# Patient Record
Sex: Male | Born: 1965 | Race: White | Hispanic: No | Marital: Married | Smoking: Current every day smoker
Health system: Southern US, Community
[De-identification: ages and names within clinical notes are randomized; demographics above are authoritative.]

## PROBLEM LIST (undated history)

## (undated) DIAGNOSIS — I10 Essential (primary) hypertension: Secondary | ICD-10-CM

## (undated) DIAGNOSIS — I639 Cerebral infarction, unspecified: Secondary | ICD-10-CM

## (undated) DIAGNOSIS — G4730 Sleep apnea, unspecified: Secondary | ICD-10-CM

## (undated) DIAGNOSIS — F4310 Post-traumatic stress disorder, unspecified: Secondary | ICD-10-CM

## (undated) HISTORY — PX: FOOT SURGERY: SHX648

## (undated) HISTORY — PX: APPENDECTOMY: SHX54

---

## 2001-10-15 ENCOUNTER — Encounter: Payer: Self-pay | Admitting: Emergency Medicine

## 2001-10-15 ENCOUNTER — Emergency Department (HOSPITAL_COMMUNITY): Admission: EM | Admit: 2001-10-15 | Discharge: 2001-10-15 | Payer: Self-pay | Admitting: Emergency Medicine

## 2001-11-03 ENCOUNTER — Encounter
Admission: RE | Admit: 2001-11-03 | Discharge: 2001-11-03 | Payer: Self-pay | Admitting: Physical Medicine and Rehabilitation

## 2012-11-14 ENCOUNTER — Encounter (HOSPITAL_BASED_OUTPATIENT_CLINIC_OR_DEPARTMENT_OTHER): Payer: Self-pay | Admitting: *Deleted

## 2012-11-14 ENCOUNTER — Emergency Department (HOSPITAL_BASED_OUTPATIENT_CLINIC_OR_DEPARTMENT_OTHER)
Admission: EM | Admit: 2012-11-14 | Discharge: 2012-11-14 | Disposition: A | Discharge status: Home / Self Care | Payer: No Typology Code available for payment source | Attending: Emergency Medicine | Admitting: Emergency Medicine

## 2012-11-14 DIAGNOSIS — W268XXA Contact with other sharp object(s), not elsewhere classified, initial encounter: Secondary | ICD-10-CM | POA: Insufficient documentation

## 2012-11-14 DIAGNOSIS — S21119A Laceration without foreign body of unspecified front wall of thorax without penetration into thoracic cavity, initial encounter: Secondary | ICD-10-CM

## 2012-11-14 DIAGNOSIS — Y929 Unspecified place or not applicable: Secondary | ICD-10-CM | POA: Insufficient documentation

## 2012-11-14 DIAGNOSIS — Z79899 Other long term (current) drug therapy: Secondary | ICD-10-CM | POA: Insufficient documentation

## 2012-11-14 DIAGNOSIS — S21109A Unspecified open wound of unspecified front wall of thorax without penetration into thoracic cavity, initial encounter: Secondary | ICD-10-CM | POA: Insufficient documentation

## 2012-11-14 DIAGNOSIS — Z23 Encounter for immunization: Secondary | ICD-10-CM | POA: Insufficient documentation

## 2012-11-14 DIAGNOSIS — F17200 Nicotine dependence, unspecified, uncomplicated: Secondary | ICD-10-CM | POA: Insufficient documentation

## 2012-11-14 DIAGNOSIS — Y939 Activity, unspecified: Secondary | ICD-10-CM | POA: Insufficient documentation

## 2012-11-14 DIAGNOSIS — F4310 Post-traumatic stress disorder, unspecified: Secondary | ICD-10-CM | POA: Insufficient documentation

## 2012-11-14 HISTORY — DX: Post-traumatic stress disorder, unspecified: F43.10

## 2012-11-14 MED ORDER — LIDOCAINE HCL 2 % IJ SOLN
INTRAMUSCULAR | Status: AC
  Filled 2012-11-14: qty 20

## 2012-11-14 MED ORDER — TETANUS-DIPHTH-ACELL PERTUSSIS 5-2.5-18.5 LF-MCG/0.5 IM SUSP
INTRAMUSCULAR | Status: AC
  Administered 2012-11-14: 0.5 mL via INTRAMUSCULAR
  Filled 2012-11-14: qty 0.5
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
## 2012-11-14 NOTE — ED Notes (Signed)
Vital signs stable. 

## 2012-11-14 NOTE — ED Notes (Signed)
Suture cart is at the bedside set up and ready for the doctor to use. 

## 2012-11-14 NOTE — ED Notes (Signed)
MD at bedside. 
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
## 2012-11-21 NOTE — ED Provider Notes (Signed)
Medical screening examination/treatment/procedure(s) were performed by non-physician practitioner and as supervising physician I was immediately available for consultation/collaboration.   Carleene Cooper III, MD 11/21/12 [phone]

## 2017-01-07 ENCOUNTER — Encounter (HOSPITAL_BASED_OUTPATIENT_CLINIC_OR_DEPARTMENT_OTHER): Payer: Self-pay

## 2017-01-07 ENCOUNTER — Emergency Department (HOSPITAL_BASED_OUTPATIENT_CLINIC_OR_DEPARTMENT_OTHER)
Admission: EM | Admit: 2017-01-07 | Discharge: 2017-01-07 | Disposition: A | Discharge status: Home / Self Care | Payer: No Typology Code available for payment source | Attending: Emergency Medicine | Admitting: Emergency Medicine

## 2017-01-07 DIAGNOSIS — M545 Low back pain: Secondary | ICD-10-CM | POA: Insufficient documentation

## 2017-01-07 DIAGNOSIS — Z5321 Procedure and treatment not carried out due to patient leaving prior to being seen by health care provider: Secondary | ICD-10-CM | POA: Insufficient documentation
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
## 2017-01-07 NOTE — ED Notes (Signed)
Pt states he doesn't wish to wait any longer.  Informed pt that he was next up but he said no.

## 2020-03-13 DIAGNOSIS — R059 Cough, unspecified: Secondary | ICD-10-CM | POA: Diagnosis not present

## 2020-03-13 DIAGNOSIS — U071 COVID-19: Secondary | ICD-10-CM | POA: Diagnosis not present

## 2020-03-13 DIAGNOSIS — R509 Fever, unspecified: Secondary | ICD-10-CM | POA: Diagnosis not present

## 2020-03-13 DIAGNOSIS — Z20822 Contact with and (suspected) exposure to covid-19: Secondary | ICD-10-CM | POA: Diagnosis not present

## 2020-03-20 DIAGNOSIS — R059 Cough, unspecified: Secondary | ICD-10-CM | POA: Diagnosis not present

## 2020-03-20 DIAGNOSIS — U071 COVID-19: Secondary | ICD-10-CM | POA: Diagnosis not present

## 2020-03-29 DIAGNOSIS — G4733 Obstructive sleep apnea (adult) (pediatric): Secondary | ICD-10-CM | POA: Diagnosis not present

## 2020-03-29 DIAGNOSIS — F4310 Post-traumatic stress disorder, unspecified: Secondary | ICD-10-CM | POA: Diagnosis not present

## 2020-03-29 DIAGNOSIS — Z23 Encounter for immunization: Secondary | ICD-10-CM | POA: Diagnosis not present

## 2020-03-29 DIAGNOSIS — R059 Cough, unspecified: Secondary | ICD-10-CM | POA: Diagnosis not present

## 2020-07-25 ENCOUNTER — Emergency Department (HOSPITAL_COMMUNITY): Payer: No Typology Code available for payment source

## 2020-07-25 ENCOUNTER — Observation Stay (HOSPITAL_COMMUNITY)
Admission: EM | Admit: 2020-07-25 | Discharge: 2020-07-26 | Disposition: A | Discharge status: Home / Self Care | Payer: No Typology Code available for payment source | Attending: Internal Medicine | Admitting: Internal Medicine

## 2020-07-25 ENCOUNTER — Other Ambulatory Visit: Payer: Self-pay

## 2020-07-25 DIAGNOSIS — I719 Aortic aneurysm of unspecified site, without rupture: Secondary | ICD-10-CM | POA: Diagnosis not present

## 2020-07-25 DIAGNOSIS — R531 Weakness: Secondary | ICD-10-CM | POA: Diagnosis not present

## 2020-07-25 DIAGNOSIS — I10 Essential (primary) hypertension: Secondary | ICD-10-CM | POA: Diagnosis not present

## 2020-07-25 DIAGNOSIS — Z87891 Personal history of nicotine dependence: Secondary | ICD-10-CM | POA: Diagnosis not present

## 2020-07-25 DIAGNOSIS — R030 Elevated blood-pressure reading, without diagnosis of hypertension: Secondary | ICD-10-CM | POA: Diagnosis not present

## 2020-07-25 DIAGNOSIS — Z20822 Contact with and (suspected) exposure to covid-19: Secondary | ICD-10-CM | POA: Diagnosis not present

## 2020-07-25 DIAGNOSIS — R29810 Facial weakness: Secondary | ICD-10-CM | POA: Diagnosis not present

## 2020-07-25 DIAGNOSIS — R200 Anesthesia of skin: Secondary | ICD-10-CM | POA: Diagnosis present

## 2020-07-25 DIAGNOSIS — R17 Unspecified jaundice: Secondary | ICD-10-CM | POA: Diagnosis not present

## 2020-07-25 DIAGNOSIS — G459 Transient cerebral ischemic attack, unspecified: Principal | ICD-10-CM | POA: Diagnosis present

## 2020-07-25 DIAGNOSIS — I351 Nonrheumatic aortic (valve) insufficiency: Secondary | ICD-10-CM | POA: Insufficient documentation

## 2020-07-25 DIAGNOSIS — R9431 Abnormal electrocardiogram [ECG] [EKG]: Secondary | ICD-10-CM | POA: Diagnosis not present

## 2020-07-25 LAB — DIFFERENTIAL
Abs Immature Granulocytes: 0.05 10*3/uL (ref 0.00–0.07)
Basophils Absolute: 0.1 10*3/uL (ref 0.0–0.1)
Basophils Relative: 1 %
Eosinophils Absolute: 0.1 10*3/uL (ref 0.0–0.5)
Eosinophils Relative: 1 %
Immature Granulocytes: 1 %
Lymphocytes Relative: 29 %
Lymphs Abs: 2.6 10*3/uL (ref 0.7–4.0)
Monocytes Absolute: 0.5 10*3/uL (ref 0.1–1.0)
Monocytes Relative: 6 %
Neutro Abs: 5.5 10*3/uL (ref 1.7–7.7)
Neutrophils Relative %: 62 %

## 2020-07-25 LAB — URINALYSIS, ROUTINE W REFLEX MICROSCOPIC
Bilirubin Urine: NEGATIVE
Glucose, UA: NEGATIVE mg/dL
Hgb urine dipstick: NEGATIVE
Ketones, ur: NEGATIVE mg/dL
Leukocytes,Ua: NEGATIVE
Nitrite: NEGATIVE
Protein, ur: NEGATIVE mg/dL
Specific Gravity, Urine: 1.004 — ABNORMAL LOW (ref 1.005–1.030)
pH: 7 (ref 5.0–8.0)

## 2020-07-25 LAB — CBC
HCT: 45.6 % (ref 39.0–52.0)
HCT: 46.9 % (ref 39.0–52.0)
Hemoglobin: 16.1 g/dL (ref 13.0–17.0)
Hemoglobin: 17.1 g/dL — ABNORMAL HIGH (ref 13.0–17.0)
MCH: 32.3 pg (ref 26.0–34.0)
MCH: 32.9 pg (ref 26.0–34.0)
MCHC: 35.3 g/dL (ref 30.0–36.0)
MCHC: 36.5 g/dL — ABNORMAL HIGH (ref 30.0–36.0)
MCV: 91.4 fL (ref 80.0–100.0)
MCV: 90.4 fL (ref 80.0–100.0)
Platelets: 174 10*3/uL (ref 150–400)
Platelets: 232 10*3/uL (ref 150–400)
RBC: 4.99 MIL/uL (ref 4.22–5.81)
RBC: 5.19 MIL/uL (ref 4.22–5.81)
RDW: 12.6 % (ref 11.5–15.5)
RDW: 12.8 % (ref 11.5–15.5)
WBC: 8.9 10*3/uL (ref 4.0–10.5)
WBC: 9.5 10*3/uL (ref 4.0–10.5)
nRBC: 0 % (ref 0.0–0.2)
nRBC: 0 % (ref 0.0–0.2)

## 2020-07-25 LAB — COMPREHENSIVE METABOLIC PANEL
ALT: 35 U/L (ref 0–44)
AST: 26 U/L (ref 15–41)
Albumin: 3.7 g/dL (ref 3.5–5.0)
Alkaline Phosphatase: 68 U/L (ref 38–126)
Anion gap: 8 (ref 5–15)
BUN: 8 mg/dL (ref 6–20)
CO2: 20 mmol/L — ABNORMAL LOW (ref 22–32)
Calcium: 8.4 mg/dL — ABNORMAL LOW (ref 8.9–10.3)
Chloride: 112 mmol/L — ABNORMAL HIGH (ref 98–111)
Creatinine, Ser: 0.89 mg/dL (ref 0.61–1.24)
GFR, Estimated: >60 mL/min (ref >60)
Glucose, Bld: 87 mg/dL (ref 70–99)
Potassium: 3.7 mmol/L (ref 3.5–5.1)
Sodium: 140 mmol/L (ref 135–145)
Total Bilirubin: 1.4 mg/dL — ABNORMAL HIGH (ref 0.3–1.2)
Total Protein: 6 g/dL — ABNORMAL LOW (ref 6.5–8.1)

## 2020-07-25 LAB — I-STAT CHEM 8, ED
BUN: 9 mg/dL (ref 6–20)
Calcium, Ion: 0.95 mmol/L — ABNORMAL LOW (ref 1.15–1.40)
Chloride: 113 mmol/L — ABNORMAL HIGH (ref 98–111)
Creatinine, Ser: 0.8 mg/dL (ref 0.61–1.24)
Glucose, Bld: 85 mg/dL (ref 70–99)
HCT: 42 % (ref 39.0–52.0)
Hemoglobin: 14.3 g/dL (ref 13.0–17.0)
Potassium: 3.6 mmol/L (ref 3.5–5.1)
Sodium: 143 mmol/L (ref 135–145)
TCO2: 22 mmol/L (ref 22–32)

## 2020-07-25 LAB — PROTIME-INR
INR: 1 (ref 0.8–1.2)
Prothrombin Time: 13.1 seconds (ref 11.4–15.2)

## 2020-07-25 LAB — LIPID PANEL
Cholesterol: 160 mg/dL (ref 0–200)
HDL: 24 mg/dL — ABNORMAL LOW (ref >40)
LDL Cholesterol: 109 mg/dL — ABNORMAL HIGH (ref 0–99)
Total CHOL/HDL Ratio: 6.7 RATIO
Triglycerides: 135 mg/dL (ref <150)
VLDL: 27 mg/dL (ref 0–40)

## 2020-07-25 LAB — RESP PANEL BY RT-PCR (FLU A&B, COVID) ARPGX2
Influenza A by PCR: NEGATIVE
Influenza B by PCR: NEGATIVE
SARS Coronavirus 2 by RT PCR: NEGATIVE

## 2020-07-25 LAB — MAGNESIUM: Magnesium: 1.9 mg/dL (ref 1.7–2.4)

## 2020-07-25 LAB — RAPID URINE DRUG SCREEN, HOSP PERFORMED
Amphetamines: NOT DETECTED
Barbiturates: NOT DETECTED
Benzodiazepines: NOT DETECTED
Cocaine: NOT DETECTED
Opiates: NOT DETECTED
Tetrahydrocannabinol: NOT DETECTED

## 2020-07-25 LAB — HEMOGLOBIN A1C
Hgb A1c MFr Bld: 5.3 % (ref 4.8–5.6)
Mean Plasma Glucose: 105.41 mg/dL

## 2020-07-25 LAB — APTT: aPTT: 25 seconds (ref 24–36)

## 2020-07-25 LAB — HIV ANTIBODY (ROUTINE TESTING W REFLEX): HIV Screen 4th Generation wRfx: NONREACTIVE

## 2020-07-25 LAB — CREATININE, SERUM
Creatinine, Ser: 0.93 mg/dL (ref 0.61–1.24)
GFR, Estimated: >60 mL/min (ref >60)

## 2020-07-25 LAB — PHOSPHORUS: Phosphorus: 2.8 mg/dL (ref 2.5–4.6)

## 2020-07-25 LAB — ETHANOL: Alcohol, Ethyl (B): <10 mg/dL (ref <10)

## 2020-07-25 IMAGING — CT CT ANGIO NECK
1 of 11 series · 5 of 33 positions shown · IV contrast (omnipaque)
Comparison: None.

CLINICAL DATA: Transient ischemic attack.

EXAM:
CT ANGIOGRAPHY HEAD AND NECK
TECHNIQUE: Multidetector CT imaging of the head and neck was performed using
the standard protocol during bolus administration of intravenous
contrast. Multiplanar CT image reconstructions and MIPs were
obtained to evaluate the vascular anatomy. Carotid stenosis
measurements (when applicable) are obtained utilizing NASCET
criteria, using the distal internal carotid diameter as the
denominator.
CONTRAST:  75mL OMNIPAQUE IOHEXOL 350 MG/ML SOLN

[Series 12: ax thins · axial · 0.39mm/px · z∈[+1058,+1306]mm · 5 of 373 slices shown]
[im 63/373  soft-tissue]
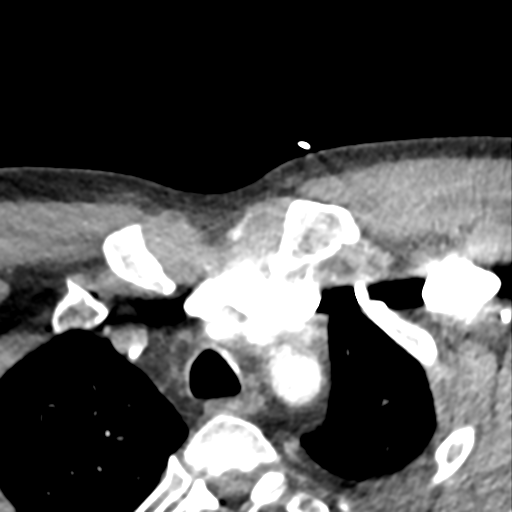
[im 125/373  bone]
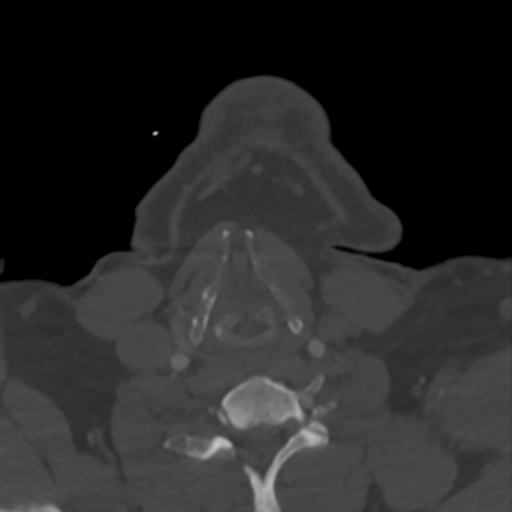
[im 187/373  soft-tissue]
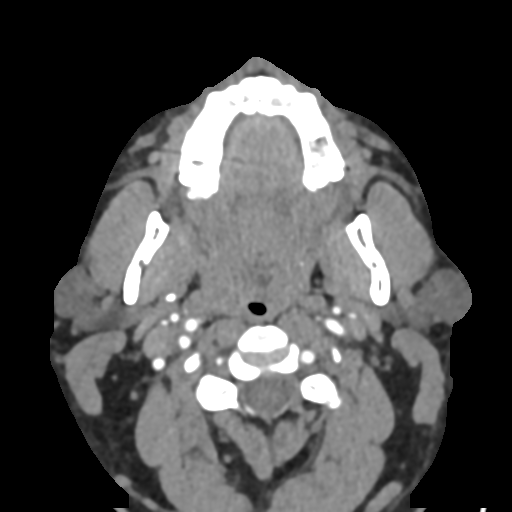
[im 249/373  bone]
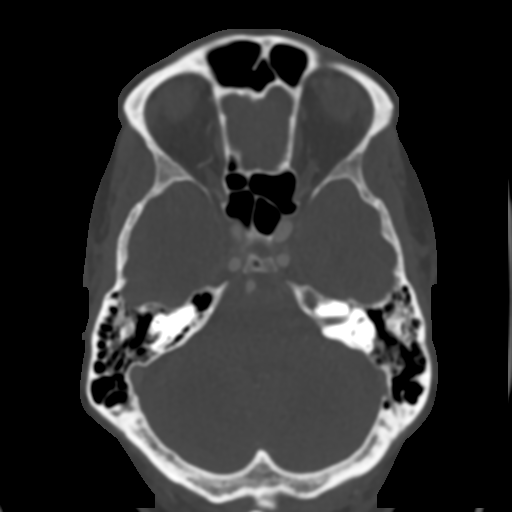
[im 311/373  soft-tissue]
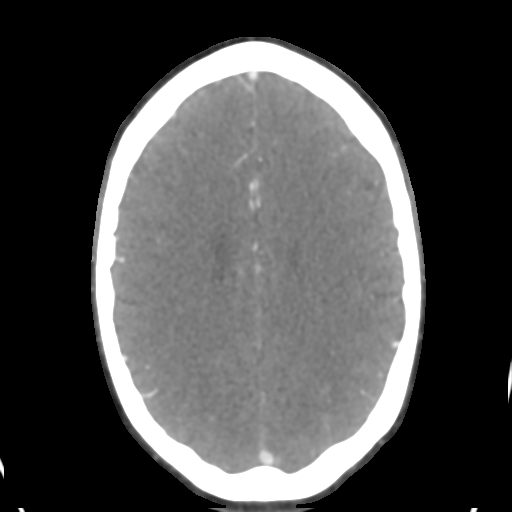

[5 of 33 positions shown; findings below may reference images not displayed]

FINDINGS: CT HEAD FINDINGS

Brain: No evidence of acute infarction, hemorrhage, hydrocephalus,
extra-axial collection or mass lesion/mass effect. Remote lacunar
infarct in the left cerebellar hemisphere.

Skull: No fracture or destructive lesion. Mastoids and middle ears
are clear.

Sinuses: Mucosal thickening of the bilateral ethmoid cells and
maxillary sinuses.

Orbits: No acute finding.

Review of the MIP images confirms the above findings

CTA NECK FINDINGS

Aortic arch: Standard branching. Imaged portion shows no evidence of
aneurysm or dissection. No significant stenosis of the major arch
vessel origins.

Right carotid system: No evidence of dissection, stenosis or
occlusion. Increased tortuosity of the cervical segment of the right
ICA.

Left carotid system: No evidence of dissection, stenosis or
occlusion. Increased tortuosity of the cervical segment of the left
ICA.

Vertebral arteries: Dominant left vertebral artery with hypoplastic
right vertebral artery. No evidence of dissection, stenosis or
occlusion.

Skeleton: Degenerative changes of the cervical spine. No aggressive
bone lesion identified.

Other neck: Negative.

Upper chest: Negative.

Review of the MIP images confirms the above findings

CTA HEAD FINDINGS

Anterior circulation: Mild calcified plaques in the bilateral
paraclinoid internal carotid arteries. No significant stenosis,
proximal occlusion, aneurysm, or vascular malformation.

Posterior circulation: Mild atherosclerotic plaques of the left
vertebral artery. The right vertebral artery ends in PICA. No
significant stenosis, proximal occlusion, aneurysm, or vascular
malformation.

Venous sinuses: As permitted by contrast timing, patent.

Anatomic variants: Hypoplastic right vertebral artery ending in
PICA.

Review of the MIP images confirms the above findings
IMPRESSION: 1. No acute intracranial abnormality. Remote left cerebellar lacunar
infarct.
2. No large vessel occlusion, hemodynamically significant stenosis,
or evidence of dissection.

## 2020-07-25 MED ORDER — CLOPIDOGREL BISULFATE 75 MG PO TABS
75.0000 mg | ORAL_TABLET | Freq: Every day | ORAL | Status: DC
  Administered 2020-07-26: 75 mg via ORAL
  Filled 2020-07-25: qty 1

## 2020-07-25 MED ORDER — ACETAMINOPHEN 325 MG PO TABS
650.0000 mg | ORAL_TABLET | Freq: Four times a day (QID) | ORAL | Status: DC | PRN
  Administered 2020-07-25 – 2020-07-26 (×2): 650 mg via ORAL
  Filled 2020-07-25 (×2): qty 2

## 2020-07-25 MED ORDER — POLYETHYLENE GLYCOL 3350 17 G PO PACK: 17.0000 g | PACK | Freq: Every day | ORAL | Status: DC | PRN

## 2020-07-25 MED ORDER — QUETIAPINE FUMARATE 100 MG PO TABS
100.0000 mg | ORAL_TABLET | Freq: Every day | ORAL | Status: DC
  Administered 2020-07-25: 100 mg via ORAL
  Filled 2020-07-25 (×2): qty 1

## 2020-07-25 MED ORDER — ENOXAPARIN SODIUM 40 MG/0.4ML ~~LOC~~ SOLN
40.0000 mg | SUBCUTANEOUS | Status: DC
  Administered 2020-07-26: 40 mg via SUBCUTANEOUS
  Filled 2020-07-25 (×2): qty 0.4

## 2020-07-25 MED ORDER — ACETAMINOPHEN 650 MG RE SUPP: 650.0000 mg | Freq: Four times a day (QID) | RECTAL | Status: DC | PRN

## 2020-07-25 MED ORDER — IOHEXOL 350 MG/ML SOLN
75.0000 mL | Freq: Once | INTRAVENOUS | Status: AC | PRN
  Administered 2020-07-25: 75 mL via INTRAVENOUS

## 2020-07-25 MED ORDER — ASPIRIN EC 81 MG PO TBEC
81.0000 mg | DELAYED_RELEASE_TABLET | Freq: Every day | ORAL | Status: DC
  Administered 2020-07-26: 81 mg via ORAL
  Filled 2020-07-25: qty 1

## 2020-07-25 NOTE — Hospital Course (Addendum)
  Smokes cigarette (5-6 a day for the last two weeks), pack per day since 5868 No ETOH  No illicit drug uses    Appy, colonoscopy a year ago No regular PCP Lives with wife
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
## 2020-07-25 NOTE — ED Notes (Signed)
This RN entered the room to update the pt's vitals because his BP cuff was off. The pt removed his BP cuff and began yelling that no one has done anything for him.  A doctor told him he was gonna get an aspirin and no one has given him an aspirin.  I asked if he had a HA and he said no but the doctor said it and it hasnt happened.  I explained that the doctor had not ordered an aspirin.  He began yelling that he was tired of waiting down here and he wanted a bed upstairs.  I tried to explain the admission process and he yelled that there weren't any other pt's out there in the hall so he didn't understand why he couldn't get a bed.  I explained that the department was full of people, including in the halls and that we had 20+ people waiting for beds upstairs.  He continued to yell that no one had done anything.    The pt has received two food trays this afternoon, had multiple doctors see him since 1500 and I have been in the room several times.   I told him I would look into the aspirin order.

## 2020-07-25 NOTE — ED Provider Notes (Signed)
4:43 PM Signout from [employer].   Patient with TIA symptoms today.  Plan for admission.  Awaiting CT imaging.  CT imaging shows remote lacunar infarct.  Neurology has seen patient.  Plan for unassigned medicine.  Patient personally evaluated and updated on CT imaging results.  Discussed with internal medicine teaching service who will admit for TIA evaluation.  BP (!) 149/94   Pulse 80   Temp 98.3 F (36.8 C) (Oral)   Resp 17   SpO2 99%       Carlisle Cater, PA-C 07/25/20 1646    Wyvonnia Dusky, MD 07/26/20 [phone]

## 2020-07-25 NOTE — ED Notes (Signed)
Pt refused to wear BP cuff at this time.

## 2020-07-25 NOTE — H&P (Addendum)
Date: 07/25/2020               Patient Name:  Ryan Middleton MRN: [id number]  DOB: [birth year] Age / Sex: 55 y.o., male   PCP: [place] Service: Internal Medicine Teaching Service         Attending Physician: Dr. Davonna Belling, MD    First Contact: Dr. Konrad Penta Pager: [phone]  Second Contact: Dr. Marianna Payment Pager: [phone]       After Hours (After 5p/  First Contact Pager: [phone]  weekends / holidays): Second Contact Pager: [phone]   Chief Complaint: Left-Sided Weakness  History of Present Illness:   Ryan Middleton is a 55 y.o. gentleman w/ PMHx PTSD and HLD, who presented to the ED after experiencing a transient episode of left arm and left leg weakness while driving to work. His symptoms came on suddenly, then dissipated slowly. He endorses associated diaphoresis during the episode, which he attributes to anxiety. He denies any other associated symptoms including numbness, tingling, slurred speech, facial numbness, headache, dizziness, CP, SOB, or any other symptoms. 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 Denies any known cardiovascular history although states he does not follow up routinely with a PCP. Denies history of diabetes although does endorse shakiness and weakness when he goes longer than 12 hours without eating, which is chronic. Has not eaten all day today.  Home Medications: Seroquel 100mg  nightly   Allergies: Allergies as of 07/25/2020 - Review Complete 01/07/2017  Allergen Reaction Noted  . Hydrocodone Nausea And Vomiting 11/14/2012  . Simvastatin Rash 01/07/2017   Past Medical History:  Diagnosis Date  . Post traumatic stress disorder     Family History:  Patient is adopted and does not know his birth family's history.  Adopted mother and father have both had strokes.   Social History:  Patient lives at home with his wife.  He smokes about 6 cigarettes per day, having recently cut back from ~1 pack per day  since 1992 (~30 pack year history). Drinks alcohol only occasionally.  Denies any illicit drug use.  Review of Systems: A complete ROS was negative except as per HPI.   Physical Exam: Blood pressure (!) 149/94, pulse 80, temperature 98.3 F (36.8 C), temperature source Oral, resp. rate 17, SpO2 99 %.  General: Patient appears well. No acute distress. Eyes: Sclera non-icteric. No conjunctival injection.  HENT: Slightly dry mucus membranes. No nasal discharge. Respiratory: Lungs are CTA, bilaterally. No wheezes, rales, or rhonchi.  Cardiovascular: Regular rate and rhythm. No murmurs, rubs, or gallops. No lower extremity edema. Neurological: CN II-XII intact. Strength is 5/5 in bilateral upper and lower extremities. Sensation to light touch is intact and equal in bilateral upper and lower extremities. No dysdiadochokinesia. Musculoskeletal: Normal muscle bulk and tone. Able to move all four extremities equally.   Abdominal: Abdomen is obese, soft and not tender to palpation. Bowel sounds intact. No rebound or guarding. Skin: Scattered seborrheic keratoses present. No other lesions or rashes.  Psych: Appears slightly anxious. Pleasant and cooperative.   EKG: personally reviewed my interpretation is NSR at 88 bpm. Normal PR, QRS, QTc intervals. No consecutive T wave inversions or ST segment changes to suggest acute ischemia. Unremarkable EKG.  CTA Head / Neck: there are mild atherosclerotic plaques within the bilateral paraclinoid ICA's and L vertebral arteries and a hypoplastic R vertebral artery with evidence of a remote L cerebellar lacunar  infarct, although no acute/significant intracranial abnormalities.   Assessment & Plan by Problem: Active Problems:   * No active hospital problems. *  # TIA Patient's transient, isolated left arm and left leg numbness are concerning for TIA. CTA head / neck in the ED did show a remote left cerebellar lacunar infarct, although no evidence of acute  hemorrhage or infarct, without significant arterial stenosis. Neurology were consulted. Urinalysis, urine toxicology, ETOH level, viral testing, PT/APTT, and CBC w/ diff were unremarkable. His symptoms resolved PTA and he has remained HDS and asymptomatic in the ED.  - Neurology consulted, appreciate their recommendations  - Check ECHO complete w/ bubble study  - Check lipid panel - Check Hgb A1c  - Check HIV testing  - Start regular diet  - Check morning CBC  - Continuous cardiac monitoring  - Continuous pulse oximetry  - Consult PT/OT - Consult TOC for PCP needs  # Hx of Hyperlipidemia  Patient states he was told he had high cholesterol 40 years ago, although is not currently taking any cholesterol-lowering medications.  - Check lipid panel  - Check morning CMP   # Elevated Blood Pressures  Blood pressure has been stably elevated since arrival. He does have a history of lacunar infarct per imaging, concerning for long-standing, untreated hypertension. Does not take any antihypertensives at home.  - Continue to monitor   # Hypocalcemia  Calcium is corrects to 8.6 for albumin. Ionized calcium is slightly low at 0.95. Denies any paresthesias, bone pain, or any symptoms.  - Check magnesium - Check phosphorus  - If magnesium is low, will replace and monitor calcium response   # Hyperbilirubinemia  Bilirubin is mildly elevated to 1.4. Liver enzymes and ALP are within normal limits. Hemoglobin is normal with normal RDW making hemolysis unlikely. No RUQ symptoms to suggest obstructive cause.  - Continue to monitor  Code Status: Full code  Diet: Regular  DVT PPx: Lovenox 40mg  daily  IVF: None   Dispo: Admit patient to Inpatient with expected length of stay greater than 2 midnights.  Signed: Jeralyn Bennett, MD 07/25/2020, 4:46 PM  Pager: [phone] After 5pm on weekdays and 1pm on weekends: On Call pager: [phone]

## 2020-07-25 NOTE — ED Provider Notes (Signed)
[facility] Provider Note   CSN: [id number] Arrival date & time: 07/25/20  1249     History Chief Complaint  Patient presents with  . Numbness    Sudden onset numbness left UE and LE  . Weakness    Ryan Middleton is a 55 y.o. male with a hx of PTSD who presents to the ED via EMS with complaints of an episode of left UE/LE numbness/weakness that began shortly after 11:15AM and is now resolved.  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  He describes this as a heaviness and having trouble keeping his arm up on the steering wheel.  This persisted until he was able to park the car, when he got out he was having some difficulty walking due to the left lower extremity dragging. He was able to drive back to his main work site and sxs seemed to be resolving but given the distribution he asked his boss to call 911. He states that currently all sxs are resolved. He still feels a bit off, but no focal numbness/weakness at this time. He has never had anything like this happen previously.  He denies any headache, visual disturbance, facial droop, speech abnormality, syncope, chest pain, or dyspnea.  Denies any alcohol or drug use.  HPI     Past Medical History:  Diagnosis Date  . Post traumatic stress disorder     There are no problems to display for this patient.   Past Surgical History:  Procedure Laterality Date  . APPENDECTOMY    . FOOT SURGERY         No family history on file.  Social History   Tobacco Use  . Smoking status: Former Research scientist (life sciences)  . Smokeless tobacco: Never Used  Substance Use Topics  . Alcohol use: Yes    Comment: occ  . Drug use: No    Home Medications Prior to Admission medications   Medication Sig Start Date End Date Taking? Authorizing Provider  QUEtiapine (SEROQUEL)  300 MG tablet Take 300 mg by mouth at bedtime.    [provider]    Allergies    Hydrocodone and Simvastatin  Review of Systems   Review of Systems  Constitutional: Negative for chills and fever.  Eyes: Negative for visual disturbance.  Respiratory: Negative for shortness of breath.   Cardiovascular: Negative for chest pain.  Gastrointestinal: Negative for abdominal pain.  Neurological: Positive for weakness and numbness. Negative for seizures, syncope, speech difficulty and headaches.  All other systems reviewed and are negative.   Physical Exam Updated Vital Signs BP (!) 145/95 (BP Location: Right Arm)   Pulse 84   Temp 98.3 F (36.8 C) (Oral)   Resp 15   SpO2 97%   Physical Exam Vitals and nursing note reviewed.  Constitutional:      General: He is not in acute distress.    Appearance: Normal appearance. He is not toxic-appearing.  HENT:     Head: Normocephalic and atraumatic.     Mouth/Throat:     Pharynx: Oropharynx is clear. Uvula midline.  Eyes:     General: Vision grossly intact. Gaze aligned appropriately.     Extraocular Movements: Extraocular movements intact.     Conjunctiva/sclera: Conjunctivae normal.     Pupils: Pupils are equal, round, and reactive to light.  Comments: No proptosis.   Cardiovascular:     Rate and Rhythm: Normal rate and regular rhythm.  Pulmonary:     Effort: Pulmonary effort is normal.     Breath sounds: Normal breath sounds.  Abdominal:     General: There is no distension.     Palpations: Abdomen is soft.     Tenderness: There is no abdominal tenderness. There is no guarding or rebound.  Musculoskeletal:     Cervical back: Normal range of motion and neck supple. No rigidity.  Skin:    General: Skin is warm and dry.  Neurological:     Mental Status: He is alert.     Comments: Alert. Clear speech. No facial droop. CNIII-XII grossly intact. Bilateral upper and lower extremities' sensation grossly intact. & intact to  sharp/dull touch. 5/5 symmetric strength with grip strength, elbow flexion/extension, and with plantar and dorsi flexion bilaterally . Normal finger to nose bilaterally. Negative pronator drift. Able to lift and hold LEs off of the stretcher. Gait intact without foot drop or noted ataxia.    Psychiatric:        Mood and Affect: Mood normal.        Behavior: Behavior normal.     ED Results / Procedures / Treatments   Labs (all labs ordered are listed, but only abnormal results are displayed) Labs Reviewed - No data to display  EKG None  Radiology No results found.  Procedures Procedures   Medications Ordered in ED Medications - No data to display  ED Course  I have reviewed the triage vital signs and the nursing notes.  Pertinent labs & imaging results that were available during my care of the patient were reviewed by me and considered in my medical decision making (see chart for details).    MDM Rules/Calculators/A&P                          Patient presents to the ED with complaints of left upper and lower extremity numbness/weakness which is currently resolved.  Patient is nontoxic, resting comfortably, his blood pressure is mildly elevated, vitals are otherwise unremarkable.  He has no focal neurologic deficits on exam.  Given reported symptoms are resolved and exam is without deficits no code stroke activation at this time.  Patient was informed that if any of his symptoms or any neurologic symptoms arise he should alert staff immediately.  We will plan for labs and CT imaging of the head neck. Anticipate admission for TIA evaluation.   Additional history obtained:  Additional history obtained from chart review & nursing note review.   Lab Tests:  I Ordered, reviewed, and interpreted labs, which included:  CBC: Unremarkable PT/INR/APTT: Within normal limits I-STAT Chem-8 with preserved renal function. Additional labs pending @ this time.  Imaging Studies ordered:  I  ordered imaging studies which included CTA head/neck.   ED Course:  15:10: CONSULT: Discussed with neurologist Dr. Cheral Marker, agrees with plan for admission, neurology service will see patient in consultation.  15:22: Patient care signed out to [facility] @ change of shift pending remaining work-up and consult for admission.   Findings and plan of care discussed with supervising physician Dr. Alvino Chapel who has evaluated the patient & is in agreement.   Portions of this note were generated with [employer]. Dictation errors may occur despite best attempts at proofreading.  Final Clinical Impression(s) / ED Diagnoses Final diagnoses:  None    Rx /  DC Orders ED Discharge Orders    None       Leafy Kindle 07/25/20 1524    Davonna Belling, MD 07/27/20 [phone]

## 2020-07-25 NOTE — ED Notes (Signed)
Pt apologized for becoming upset earlier.

## 2020-07-25 NOTE — ED Notes (Signed)
Pt transported to CT ?

## 2020-07-26 ENCOUNTER — Inpatient Hospital Stay (HOSPITAL_COMMUNITY): Payer: No Typology Code available for payment source

## 2020-07-26 ENCOUNTER — Inpatient Hospital Stay (HOSPITAL_BASED_OUTPATIENT_CLINIC_OR_DEPARTMENT_OTHER): Payer: No Typology Code available for payment source

## 2020-07-26 DIAGNOSIS — E7800 Pure hypercholesterolemia, unspecified: Secondary | ICD-10-CM

## 2020-07-26 DIAGNOSIS — E785 Hyperlipidemia, unspecified: Secondary | ICD-10-CM | POA: Diagnosis not present

## 2020-07-26 DIAGNOSIS — Z87891 Personal history of nicotine dependence: Secondary | ICD-10-CM | POA: Diagnosis not present

## 2020-07-26 DIAGNOSIS — I351 Nonrheumatic aortic (valve) insufficiency: Secondary | ICD-10-CM | POA: Diagnosis not present

## 2020-07-26 DIAGNOSIS — R030 Elevated blood-pressure reading, without diagnosis of hypertension: Secondary | ICD-10-CM | POA: Diagnosis not present

## 2020-07-26 DIAGNOSIS — F1010 Alcohol abuse, uncomplicated: Secondary | ICD-10-CM

## 2020-07-26 DIAGNOSIS — G459 Transient cerebral ischemic attack, unspecified: Secondary | ICD-10-CM | POA: Diagnosis not present

## 2020-07-26 DIAGNOSIS — F17200 Nicotine dependence, unspecified, uncomplicated: Secondary | ICD-10-CM | POA: Diagnosis not present

## 2020-07-26 DIAGNOSIS — Z20822 Contact with and (suspected) exposure to covid-19: Secondary | ICD-10-CM | POA: Diagnosis not present

## 2020-07-26 DIAGNOSIS — R17 Unspecified jaundice: Secondary | ICD-10-CM | POA: Diagnosis not present

## 2020-07-26 DIAGNOSIS — I719 Aortic aneurysm of unspecified site, without rupture: Secondary | ICD-10-CM | POA: Diagnosis not present

## 2020-07-26 LAB — ECHOCARDIOGRAM COMPLETE BUBBLE STUDY
AR max vel: 3 cm2
AV Area VTI: 3.14 cm2
AV Area mean vel: 3.26 cm2
AV Mean grad: 6 mmHg
AV Peak grad: 10.5 mmHg
Ao pk vel: 1.62 m/s
Area-P 1/2: 3.02 cm2
Calc EF: 63.4 %
P 1/2 time: 506 msec
S' Lateral: 1.8 cm
Single Plane A2C EF: 71.4 %
Single Plane A4C EF: 53 %

## 2020-07-26 LAB — COMPREHENSIVE METABOLIC PANEL
ALT: 34 U/L (ref 0–44)
AST: 19 U/L (ref 15–41)
Albumin: 3.7 g/dL (ref 3.5–5.0)
Alkaline Phosphatase: 80 U/L (ref 38–126)
Anion gap: 7 (ref 5–15)
BUN: 10 mg/dL (ref 6–20)
CO2: 24 mmol/L (ref 22–32)
Calcium: 9.3 mg/dL (ref 8.9–10.3)
Chloride: 108 mmol/L (ref 98–111)
Creatinine, Ser: 1.08 mg/dL (ref 0.61–1.24)
GFR, Estimated: >60 mL/min (ref >60)
Glucose, Bld: 112 mg/dL — ABNORMAL HIGH (ref 70–99)
Potassium: 3.7 mmol/L (ref 3.5–5.1)
Sodium: 139 mmol/L (ref 135–145)
Total Bilirubin: 0.9 mg/dL (ref 0.3–1.2)
Total Protein: 6.3 g/dL — ABNORMAL LOW (ref 6.5–8.1)

## 2020-07-26 LAB — CBC
HCT: 47.1 % (ref 39.0–52.0)
Hemoglobin: 16.9 g/dL (ref 13.0–17.0)
MCH: 32.4 pg (ref 26.0–34.0)
MCHC: 35.9 g/dL (ref 30.0–36.0)
MCV: 90.4 fL (ref 80.0–100.0)
Platelets: 195 10*3/uL (ref 150–400)
RBC: 5.21 MIL/uL (ref 4.22–5.81)
RDW: 12.6 % (ref 11.5–15.5)
WBC: 9.3 10*3/uL (ref 4.0–10.5)
nRBC: 0 % (ref 0.0–0.2)

## 2020-07-26 IMAGING — MR MR HEAD W/O CM
12 of 13 series · 44 of 48 positions shown · non-contrast
Comparison: None.

CLINICAL DATA: Transient ischemic attack

EXAM:
MRI HEAD WITHOUT CONTRAST
TECHNIQUE: Multiplanar, multiecho pulse sequences of the brain and surrounding
structures were obtained without intravenous contrast.

[Series 5: DWI · axial · 3.0mm · 0.88mm/px · z∈[-110,+47]mm · 8 of 108 slices shown (1 of 4)]
[im 1/108]
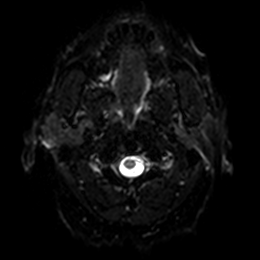
[im 16/108]
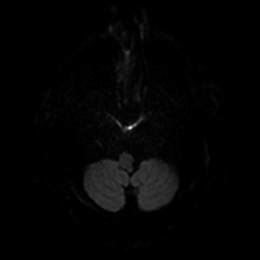
[im 31/108]
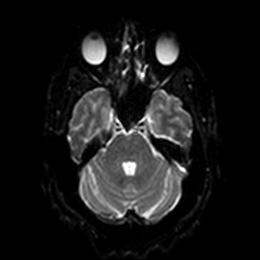
[im 46/108]
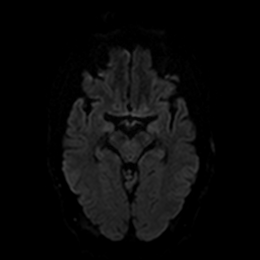
[im 62/108]
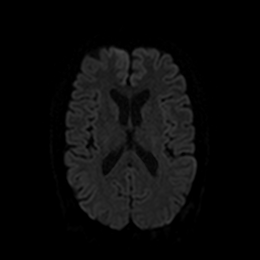
[im 77/108]
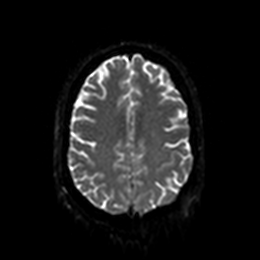
[im 92/108]
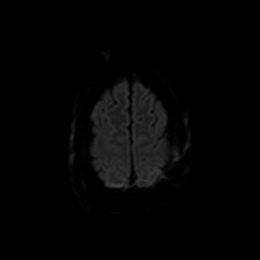
[im 108/108]
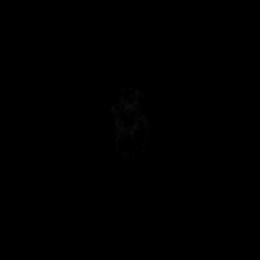

[Series 6: DWI · axial · 3.0mm · 0.88mm/px · z∈[-110,+47]mm · 4 of 54 slices shown (2 of 4)]
[im 1/54]
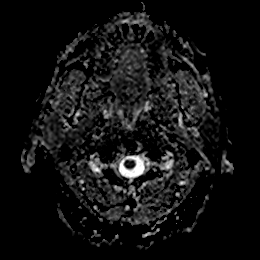
[im 18/54]
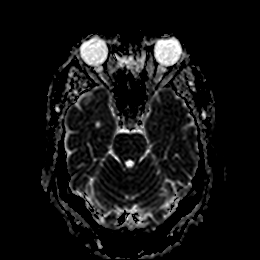
[im 36/54]
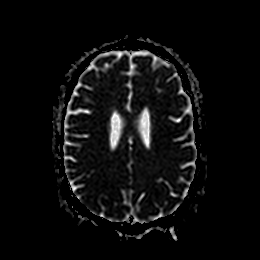
[im 54/54]
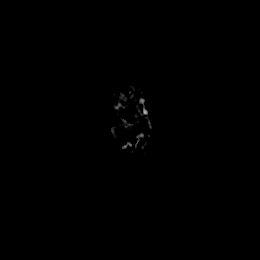

[Series 7: DWI · coronal · 4.0mm · 0.88mm/px · 5 of 70 slices shown (3 of 4)]
[im 1/70]
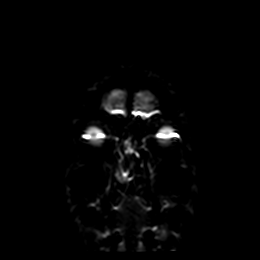
[im 18/70]
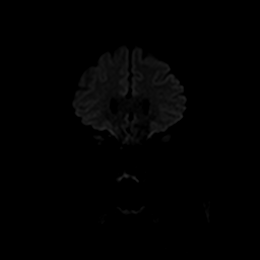
[im 35/70]
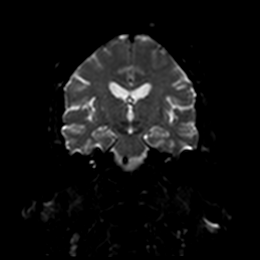
[im 52/70]
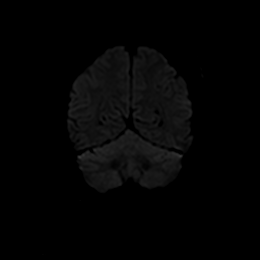
[im 70/70]
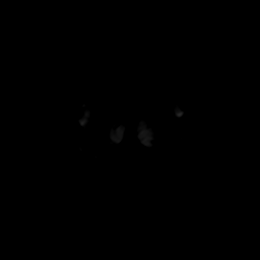

[Series 8: DWI · coronal · 4.0mm · 0.88mm/px · 3 of 35 slices shown (4 of 4)]
[im 1/35]
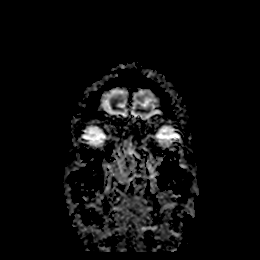
[im 18/35]
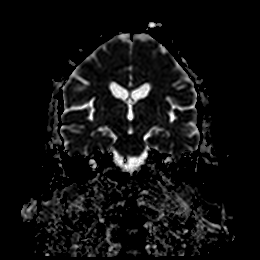
[im 35/35]
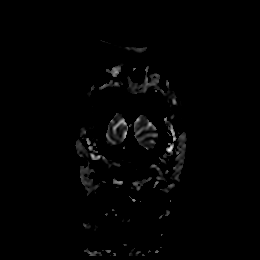

[Series 9: T1 · sagittal · 5.0mm · 0.75mm/px · 2 of 23 slices shown]
[im 1/23]
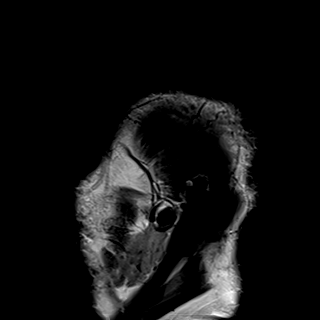
[im 23/23]
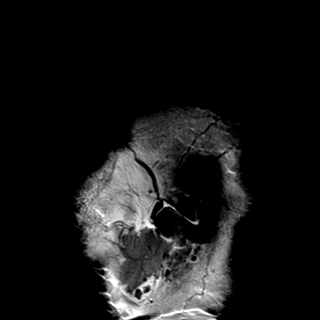

[Series 10: T2 · axial · 5.0mm · 0.72mm/px · z∈[-108,+45]mm · 2 of 27 slices shown (1 of 2)]
[im 1/27]
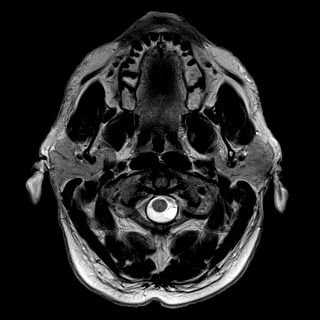
[im 27/27]
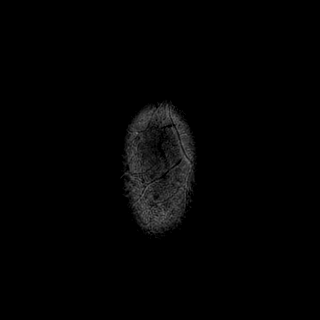

[Series 11: FLAIR · axial · 5.0mm · 0.45mm/px · z∈[-107,+47]mm · 2 of 27 slices shown]
[im 1/27]
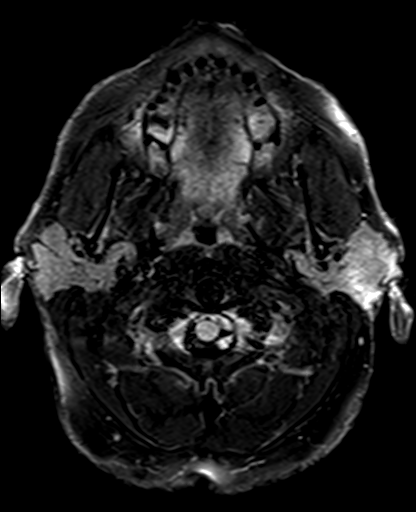
[im 27/27]
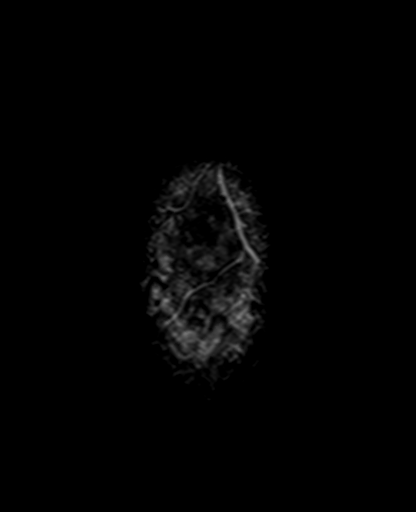

[Series 12: mag_images · axial · 3.0mm · 0.90mm/px · z∈[-117,+57]mm · 4 of 60 slices shown]
[im 1/60]
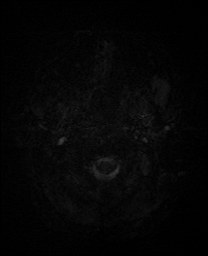
[im 20/60]
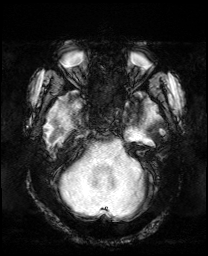
[im 40/60]
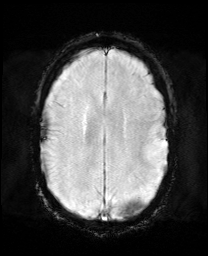
[im 60/60]
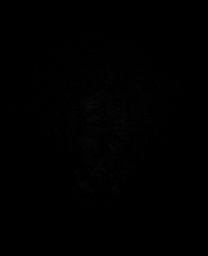

[Series 13: pha_images · axial · 3.0mm · 0.90mm/px · z∈[-117,+48]mm · 4 of 57 slices shown]
[im 1/57]
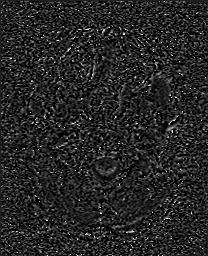
[im 19/57]
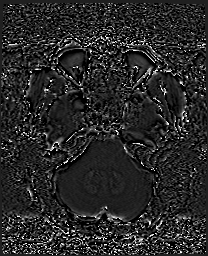
[im 38/57]
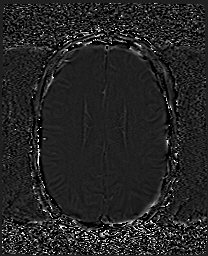
[im 57/57]
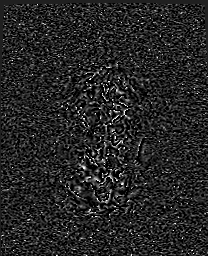

[Series 14: swi_images · axial · 3.0mm · 0.90mm/px · z∈[-117,+57]mm · 4 of 60 slices shown]
[im 1/60]
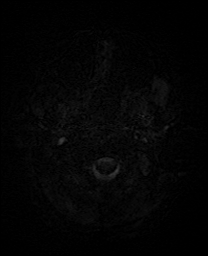
[im 20/60]
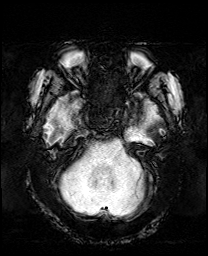
[im 40/60]
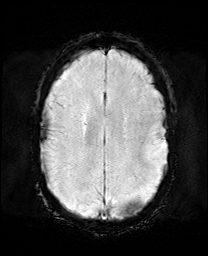
[im 60/60]
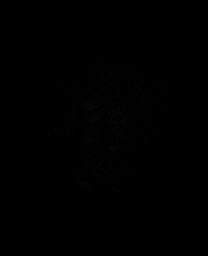

[Series 15: mip_images(sw) · axial · 24.0mm · 0.90mm/px · z∈[-107,+47]mm · 4 of 53 slices shown]
[im 1/53]
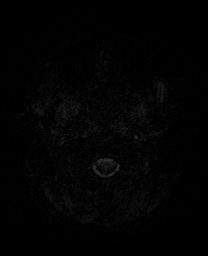
[im 18/53]
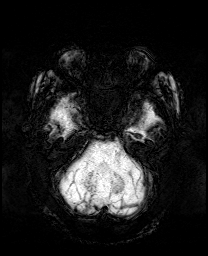
[im 35/53]
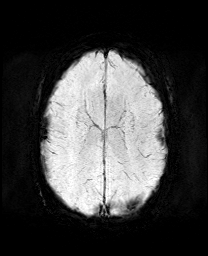
[im 53/53]
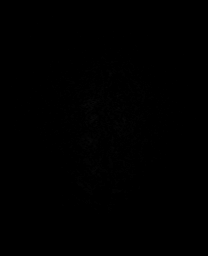

[Series 17: T2 · coronal · 5.0mm · 0.34mm/px · 2 of 29 slices shown (2 of 2)]
[im 1/29]
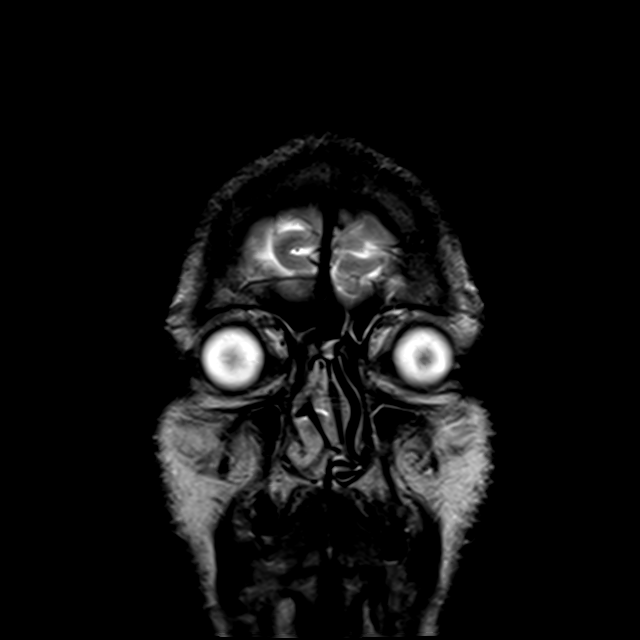
[im 29/29]
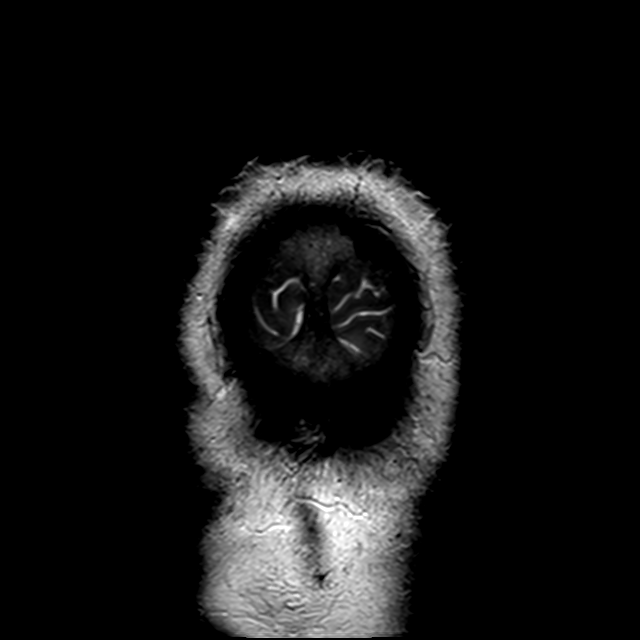

[44 of 48 positions shown; findings below may reference images not displayed]

FINDINGS: Brain: No acute infarct, mass effect or extra-axial collection. No
acute or chronic hemorrhage. Old left cerebellar infarct. Normal
white matter signal. Normal CSF spaces. The midline structures are
normal.

Vascular: Major flow voids are preserved.

Skull and upper cervical spine: Normal calvarium and skull base.
Visualized upper cervical spine and soft tissues are normal.

Sinuses/Orbits:No paranasal sinus fluid levels or advanced mucosal
thickening. No mastoid or middle ear effusion. Normal orbits.
IMPRESSION: 1. No acute intracranial abnormality.
2. Old left cerebellar infarct.

## 2020-07-26 MED ORDER — CLOPIDOGREL BISULFATE 75 MG PO TABS: 75.0000 mg | ORAL_TABLET | Freq: Every day | ORAL | 0 refills | Status: AC

## 2020-07-26 MED ORDER — ATORVASTATIN CALCIUM 40 MG PO TABS: 40.0000 mg | ORAL_TABLET | Freq: Every day | ORAL | 0 refills | Status: DC

## 2020-07-26 MED ORDER — ATORVASTATIN CALCIUM 40 MG PO TABS
40.0000 mg | ORAL_TABLET | Freq: Every day | ORAL | Status: DC
  Administered 2020-07-26: 40 mg via ORAL
  Filled 2020-07-26: qty 1

## 2020-07-26 MED ORDER — ASPIRIN 81 MG PO TBEC: 81.0000 mg | DELAYED_RELEASE_TABLET | Freq: Every day | ORAL | 1 refills | Status: DC

## 2020-07-26 NOTE — ED Notes (Signed)
Pt placed in hospital bed.  States was notified by MD that he may be able to go home if the ECHO and EEG tests had resulted.

## 2020-07-26 NOTE — Procedures (Signed)
EEG Report Indication: Transient focal neurologic symptoms (LUE/LLE numbness) of uncertain etiology  This study was recorded in the waking and (brief) sleep state.  The duration of the study was 23 minutes.  Electrodes were placed according to the International 10/20 system.  Video was reviewed available for clinical correlation as needed.  In the waking state, clear background organization is seen with a distinct anterior - posterior voltage and frequency gradient and a symmetric and reactive posterior dominant rhythm of approximately 10 - 11 hertz.  Anteriorly, is the expected pattern of faster frequency, lower voltage waveforms.  During the drowsy state, there is attenuation of the gradient, a general shift to slower frequencies diffusely, a waxing and waning of the posterior dominant rhythm, and slow roving eye movements.  During sleep, there were well formed and symmetric sleep transients.   Hyperventilation: deferred Photic stimulation: unremarkable  There are no clear focal, paroxysmal or epileptiform abnormalities  or interhemispheric asymmetries.  Impression:  This is a normal waking and sleep study.  There are no clear focal or epileptiform abnormalities.

## 2020-07-26 NOTE — Discharge Summary (Signed)
Name: Ryan Middleton MRN: [id number] DOB: 21-Aug-1965 55 y.o. PCP: [facility]  Date of Admission: 07/25/2020 12:50 PM Date of Discharge:  07/27/20 Attending Physician: Dr. Aldine Contes  Discharge Diagnosis: 1. TIA 2. Aortic Root Aneurysm (80mm) 3. Mild Aortic Regurgitation 4. HLD 5. Elevated BP 6. Hypocalcemia 7. Hyperbilirubinemia  Discharge Medications: Allergies as of 07/26/2020      Reactions   Hydrocodone Nausea And Vomiting   Simvastatin Rash      Medication List    TAKE these medications   aspirin 81 MG EC tablet Take 1 tablet (81 mg total) by mouth daily. Swallow whole.   atorvastatin 40 MG tablet Commonly known as: LIPITOR Take 1 tablet (40 mg total) by mouth daily.   clopidogrel 75 MG tablet Commonly known as: PLAVIX Take 1 tablet (75 mg total) by mouth daily for 21 days.   QUEtiapine 100 MG tablet Commonly known as: SEROQUEL Take 100 mg by mouth at bedtime.       Disposition and follow-up:   Mr.Ryan Middleton was discharged from [facility] in Good condition.  At the hospital follow up visit please address:  1.  Acute Issues to Follow Up:  TIA: Discharged on aspirin and Plavix x3 weeks, then aspirin alone. Started on atorvastatin 40 mg daily. Ensure neurology follow-up in 4 weeks. Continue to monitor for symptoms / RF modification. Elevated BP: Elevated during admission w/ hx of lacunar infarct, concerning for long-standing undiagnosed hypertension. No medications added at discharge. Goal <120/80 long-term. Aortic Root Aneurysm / Mild AR - Will require follow up CT imaging / future ECHO follow up Hypocalcemia/Hyperbilirubinemia: Patient initially had mild hypocalcemia with mag of 1.9 and mildly elevated bilirubin. Both resolved on repeat CMP. F/u labs.   2.  Labs / imaging needed at time of follow-up: CBC, CMP, lung cancer screening CT, colonoscopy, eventual follow up CT for aortic root aneurysm.  3.  Pending labs/  test needing follow-up: None  Follow-up Appointments:  Follow-up Information    [facility]. Schedule an appointment as soon as possible for a visit in 4 week(s).   Specialty: Neurology Contact information: [address] [facility] Course by problem list:  1. TIA: 55 year old male with a history of PTSD and hyperlipidemia came in with a transient episode of left arm and left leg weakness while driving to work.  Was a sudden onset associated with diaphoresis and anxiety.  Denied any other associated symptoms. He reports smoking.  Symptoms had resolved on arrival to the ED.  CTA head and neck showed a remote left cerebellar lacunar infarct, no acute infarct or intracranial abnormality, no significant arterial stenosis.  Neurology was consulted.  MRI showed no acute intracranial abnormality, old left cerebellar infarct.  Echocardiogram with normal systolic function, without cardiac structural abnormalities, WMA's, or evidence of PFO. LDL was 109. A1c was 5.3. Patient was started on aspirin 81 mg daily and clopidogrel 75 mg daily. Also started on atorvastatin 40 mg daily. Patient has a history of migraines that he gets approximately every 2 months, follows up with the [place] for this. While symptoms could represent a complex migraine, his symptoms are not typical of his usual migraines and without history of similar headaches would be unlikely. EEG was unremarkable. Discussed risk factor modifications for future strokes. PT and OT evaluated with no further recommendations.  Patient will have follow-up with  neurology in 4 weeks. Will need follow-up with PCP in 1 to 2 weeks.  2. HLD: Patient has been told his cholesterol was high in the past. Chart review shows a rash allergy to simvastatin, although he denies ever having been on this. Takes no current medications for HLD. His LDL was 109 here. Patient started on  atorvastatin 40 mg daily and discharged with this medication.  3. Elevated BP: Blood pressure mildly elevated while admitted. Improved down to 130s over 80s on day of discharge. Not discharged with antihypertensive. Continue to follow-up with PCP.  4. Hypocalcemia: Corrected calcium on admission was 8.6.  Mag and phos were 1.9 and 2.8 respectively. Normalized on repeat CBC. Will need repeat CMP and further work-up in outpatient setting.  6. Hyperbilirubinemia: Bilirubin on admission was 1.4, improved down to 0.9. Continue to monitor outpatient.  Discharge Exam:   BP (!) 150/92   Pulse 77   Temp 97.8 F (36.6 C) (Oral)   Resp 16   SpO2 96%  Discharge exam: General: Patient appears well. No acute distress. Eyes: Sclera non-icteric. No conjunctival injection.  HENT:Slightly dry mucus membranes. No nasal discharge. Respiratory: Lungs are CTA, bilaterally. No wheezes, rales, or rhonchi.  Cardiovascular: Regular rate and rhythm. No murmurs, rubs, or gallops. No lower extremity edema. Neurological: CN II-XII intact. Strength is 5/5 in bilateral upper and lower extremities. Sensation to light touch is intact and equal in bilateral upper and lower extremities. No dysdiadochokinesia. Musculoskeletal: Normal muscle bulk and tone. Able to move all four extremities equally. Abdominal:Abdomen is obese, soft and not tender to palpation. Bowel sounds intact. No rebound or guarding. Skin:Scattered seborrheic keratoses present. No other lesions or rashes. Psych:Appears slightly anxious. Pleasant and cooperative.  Pertinent Labs, Studies, and Procedures:  CBC Latest Ref Rng & Units 07/26/2020 07/25/2020 07/25/2020  WBC 4.0 - 10.5 K/uL 9.3 9.5 -  Hemoglobin 13.0 - 17.0 g/dL 16.9 17.1(H) 14.3  Hematocrit 39.0 - 52.0 % 47.1 46.9 42.0  Platelets 150 - 400 K/uL 195 232 -   BMP Latest Ref Rng & Units 07/26/2020 07/25/2020 07/25/2020  Glucose 70 - 99 mg/dL 112(H) - 85  BUN 6 - 20 mg/dL 10 - 9   Creatinine 0.61 - 1.24 mg/dL 1.08 0.93 0.80  Sodium 135 - 145 mmol/L 139 - 143  Potassium 3.5 - 5.1 mmol/L 3.7 - 3.6  Chloride 98 - 111 mmol/L 108 - 113(H)  CO2 22 - 32 mmol/L 24 - -  Calcium 8.9 - 10.3 mg/dL 9.3 - -   CTA head and neck: IMPRESSION: 1. No acute intracranial abnormality. Remote left cerebellar lacunar infarct. 2. No large vessel occlusion, hemodynamically significant stenosis, or evidence of dissection.  MRI brain: IMPRESSION: 1. No acute intracranial abnormality. 2. Old left cerebellar infarct.  EEG: Unremarkable  ECHO w/ Bubble Study 07/26/20: 1. Left ventricular ejection fraction, by estimation, is 65 to 70%. The  left ventricle has normal function. The left ventricle has no regional  wall motion abnormalities. Left ventricular diastolic parameters were  normal.  2. Right ventricular systolic function is normal. The right ventricular  size is normal. There is normal pulmonary artery systolic pressure. The  estimated right ventricular systolic pressure is 67.6 mmHg.  3. The mitral valve is normal in structure. No evidence of mitral valve  regurgitation.  4. The aortic valve was not well visualized. Aortic valve regurgitation  is mild. No aortic stenosis is present.  5. The inferior vena cava is normal in size with greater than 50%  respiratory variability, suggesting right atrial pressure of 3 mmHg.  6. Aortic dilatation noted. Aneurysm of the aortic root, measuring 45 mm.  7. Technically difficult bubble study, no evidence of shunt seen but  study is indeterminate due to poor quality images   Discharge Instructions: Discharge Instructions    Ambulatory referral to Neurology   Complete by: As directed    Follow up with stroke clinic NP (Jessica Vanschaick or Cecille Rubin, if both not available, consider Zachery Dauer, or [facility]) at [facility] in about 4 weeks. Thanks.   Call MD for:  difficulty breathing, headache or visual disturbances   Complete  by: As directed    Call MD for:  extreme fatigue   Complete by: As directed    Call MD for:  hives   Complete by: As directed    Call MD for:  persistant dizziness or light-headedness   Complete by: As directed    Call MD for:  persistant nausea and vomiting   Complete by: As directed    Call MD for:  redness, tenderness, or signs of infection (pain, swelling, redness, odor or green/yellow discharge around incision site)   Complete by: As directed    Call MD for:  severe uncontrolled pain   Complete by: As directed    Call MD for:  temperature >100.4   Complete by: As directed    Diet - low sodium heart healthy   Complete by: As directed    Discharge instructions   Complete by: As directed    Jacalyn Lefevre,   It has been a pleasure working with you and we are glad you're feeling better. You were hospitalized for your sudden arm weakness and difficulty walking. We ran some labs tests and imaging. It looks like you had a mini stroke or a transient ischemic attack. To decrease your risk of this occurring again, please start taking aspirin and plavix daily for 3 weeks, then you can decrease it to only aspirin daily.  Your cholesterol was elevated, please start taking atorvastatin 40 mg daily. Please work on stopping smoking. These can increase your risk of having a future stroke.   Please follow up with [facility] in 4 weeks.  Follow up with your primary care provider in 1-2 weeks  If your symptoms worsen or you develop new symptoms, please seek medical help whether it is your primary care provider or emergency department.   Increase activity slowly   Complete by: As directed       Signed: Jeralyn Bennett, MD 07/27/2020, 5:12 PM   Pager: [phone]

## 2020-07-26 NOTE — Progress Notes (Signed)
PT Cancellation Note  Patient Details Name: Ryan Middleton Age MRN: [id number] DOB: Jan 29, 1966   Cancelled Treatment:    Reason Eval/Treat Not Completed: Other (comment).  Pt declines PT services, and per MRI is cleared for neuro event.  Please re-order if pt is found to be in need of PT.   Ramond Dial 07/26/2020, 1:11 PM   Mee Hives, PT [facility] Acute Rehab Dept. Number: [place]

## 2020-07-26 NOTE — ED Notes (Signed)
Pt transported to MRI 
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
## 2020-07-26 NOTE — Progress Notes (Signed)
   Subjective:  Ryan Middleton reported he was feeling well. Denies any new neurological symptoms.  Discussed risk factor modifications. Discussed new medications.  He reported he does not remember taking simvastatin, has simvastatin listed as and allergy in his chart.   Objective:  Vital signs in last 24 hours: Vitals:   07/25/20 2300 07/25/20 2330 07/26/20 0200 07/26/20 0500  BP: 119/71 122/71 120/68 124/79  Pulse: 71 66 68 (!) 59  Resp: 16 15 12 12   Temp:      TempSrc:      SpO2: 95% 96% 94% 96%   General: Patient appears well. No acute distress. Eyes: Sclera non-icteric. No conjunctival injection.  HENT: Slightly dry mucus membranes. No nasal discharge. Respiratory: Lungs are CTA, bilaterally. No wheezes, rales, or rhonchi.  Cardiovascular: Regular rate and rhythm. No murmurs, rubs, or gallops. No lower extremity edema. Neurological: CN II-XII intact. Strength is 5/5 in bilateral upper and lower extremities. Sensation to light touch is intact and equal in bilateral upper and lower extremities. No dysdiadochokinesia. Musculoskeletal: Normal muscle bulk and tone. Able to move all four extremities equally.   Abdominal: Abdomen is obese, soft and not tender to palpation. Bowel sounds intact. No rebound or guarding. Skin: Scattered seborrheic keratoses present. No other lesions or rashes.  Psych: Appears slightly anxious. Pleasant and cooperative.   Assessment/Plan:  Active Problems:   TIA (transient ischemic attack)  # TIA Patient's transient, isolated left arm and left leg numbness are concerning for TIA. CTA head / neck in the ED did show a remote left cerebellar lacunar infarct, although no evidence of acute hemorrhage or infarct, without significant arterial stenosis. Neurology were consulted. Urinalysis, urine toxicology, ETOH level, viral testing, PT/APTT, and CBC w/ diff were unremarkable. His symptoms resolved PTA and he has remained HDS and asymptomatic in the ED. MRI showed  no evidence of acute infarct.  EEG showed no no focal or epileptiform abnormalities.  PT/OT staff no further recommendations.  Patient is doing well, we will follow up echocardiogram in May be able to be discharged later today.  - Neurology consulted, appreciate their recommendations  - F/u ECHO complete w/ bubble study  - Lipid panel: LDL 109 - Hgb A1c - 5.3 - HIV testing non reactive - Continue regular diet  - Continuous cardiac monitoring   # Hx of Hyperlipidemia  Patient states he was told he had high cholesterol 40 years ago, although is not currently taking any cholesterol-lowering medications.  Does not remember taking simvastatin.  -Atorvastatin 40 mg daily  # Elevated Blood Pressures  Blood pressure was elevated on admission, has improved to around 130/80. - Continue to monitor   # Hypocalcemia  Calcium today is 9.3 from 8.4 yesterday.  Magnesium 1.9.  Phos 2.8.  - If magnesium is low, will replace and monitor calcium response   # Hyperbilirubinemia  Bilirubin improved to 0.9 from 1.4. - Continue to monitor  Code Status: Full code  Diet: Regular  DVT PPx: Lovenox 40mg  daily  IVF: None   Prior to Admission Living Arrangement: Home Anticipated Discharge Location: Home  Barriers to Discharge: Completion of TIA work up Dispo: Anticipated discharge in approximately 0-1 day(s).   Asencion Noble, M.D. PGY3 Pager [phone] 07/26/2020 6:41 PM After 5pm on weekdays and 1pm on weekends: On Call pager [phone]

## 2020-07-26 NOTE — Progress Notes (Cosign Needed)
EEG complete - results pending 

## 2020-07-26 NOTE — ED Notes (Signed)
Called bed placement RE bed for pt.  They stated they have no neuro beds at this time.

## 2020-07-26 NOTE — Progress Notes (Signed)
Occupational Therapy Evaluation Patient Details Name: Ryan Middleton MRN: [id number] DOB: 1965-09-29 Today's Date: 07/26/2020    History of Present Illness 55 y.o. male with a medical history significant for post traumatic stress disorder, hyperlipidemia, and tobacco use who presented to the ED via EMS for evaluation of transient left upper and lower extremity numbness and weakness. MRI (-) for CVA; old lacunar CVA   Clinical Impression  PTA pt lives independently with his wife and children and works as a Sales executive. Pt feels that all symptoms have resolved. Pt independent with ADL and mobility and scored a 24 on the DGI. Reviewed signs/symptoms of stroke using BeFast. Pt discussed issues with stress and anger management - sees a psychiatrist 2x/yr but is not actively receiving counseling. Pt open to the idea and seeing a counselor - CM notified. No further OT needed. OT signing off. THanks    Follow Up Recommendations  No OT follow up;Other (comment) (outpt counseling for stress/anxiety/anger management)    Equipment Recommendations  None recommended by OT    Recommendations for Other Services       Precautions / Restrictions Precautions Precautions: None      Mobility Bed Mobility Overal bed mobility: Independent                  Transfers Overall transfer level: Independent                    Balance Overall balance assessment: Independent                               Standardized Balance Assessment Standardized Balance Assessment : Dynamic Gait Index   Dynamic Gait Index Level Surface: Normal Change in Gait Speed: Normal Gait with Horizontal Head Turns: Normal Gait with Vertical Head Turns: Normal Gait and Pivot Turn: Normal Step Over Obstacle: Normal Step Around Obstacles: Normal Steps: Normal Total Score: 24     ADL either performed or assessed with clinical judgement   ADL Overall ADL's : Independent                                              Vision Baseline Vision/History: Wears glasses Wears Glasses: At all times Vision Assessment?: Yes Eye Alignment: Within Functional Limits Ocular Range of Motion: Within Functional Limits Alignment/Gaze Preference: Within Defined Limits Tracking/Visual Pursuits: Able to track stimulus in all quads without difficulty Saccades: Within functional limits Convergence: Within functional limits Visual Fields: No apparent deficits     Perception     Praxis Praxis Praxis tested?: Within functional limits    Pertinent Vitals/Pain Pain Assessment: No/denies pain     Hand Dominance Right   Extremity/Trunk Assessment Upper Extremity Assessment Upper Extremity Assessment: Overall WFL for tasks assessed   Lower Extremity Assessment Lower Extremity Assessment: Overall WFL for tasks assessed   Cervical / Trunk Assessment Cervical / Trunk Assessment: Normal   Communication Communication Communication: No difficulties   Cognition Arousal/Alertness: Awake/alert Behavior During Therapy: WFL for tasks assessed/performed (frustrated with being in ED for 24 hrs; discussed feelings of stress/difficulty with anger management) Overall Cognitive Status: Within Functional Limits for tasks assessed  General Comments: Has hx of TBI - most likely at baseline   General Comments  Educated on [employer]    Exercises     Shoulder Instructions      Home Living Family/patient expects to be discharged to:: Private residence Living Arrangements: Spouse/significant other;Children Available Help at Discharge: Family;Available 24 hours/day Type of Home: House Home Access: Stairs to enter [employer] of Steps: 1   Home Layout: Two level Alternate Level Stairs-Number of Steps: flight Alternate Level Stairs-Rails: Left Bathroom Shower/Tub: Tub/shower unit;Walk-in shower   Bathroom Toilet:  Standard Bathroom Accessibility: Yes How Accessible: Accessible via walker Home Equipment: None          Prior Functioning/Environment Level of Independence: Independent        Comments: works as welder/building maintenance        OT Problem List:        OT Treatment/Interventions:      OT Goals(Current goals can be found in the care plan section) Acute Rehab OT Goals Patient Stated Goal: to go home OT Goal Formulation: All assessment and education complete, DC therapy  OT Frequency:     Barriers to D/C:            Co-evaluation              AM-PAC OT "6 Clicks" Daily Activity     Outcome Measure Help from another person eating meals?: None Help from another person taking care of personal grooming?: None Help from another person toileting, which includes using toliet, bedpan, or urinal?: None Help from another person bathing (including washing, rinsing, drying)?: None Help from another person to put on and taking off regular upper body clothing?: None Help from another person to put on and taking off regular lower body clothing?: None 6 Click Score: 24   End of Session Nurse Communication: Other (comment) (recommend counseling as outpt for anxiety/stress/anger management)  Activity Tolerance: Patient tolerated treatment well Patient left: in bed;with call bell/phone within reach;with family/visitor present  OT Visit Diagnosis: Muscle weakness (generalized) (M62.81)                Time: 2956-2130 OT Time Calculation (min): 18 min Charges:  OT General Charges $OT Visit: 1 Visit OT Evaluation $OT Eval Low Complexity: [place], OT/L   Acute OT Clinical Specialist Acute Rehabilitation Services Pager [phone] Office [phone]   [facility] 07/26/2020, 11:45 AM

## 2020-07-26 NOTE — Progress Notes (Signed)
EEG complete - results pending 

## 2020-07-26 NOTE — Discharge Planning (Signed)
Pt active at [facility] MD: Sheela Stack SW: Gabriel Earing   Pager: [phone] Desk phone: [phone]
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
## 2020-08-23 ENCOUNTER — Encounter: Payer: Self-pay | Admitting: Adult Health

## 2020-08-23 ENCOUNTER — Other Ambulatory Visit: Payer: Self-pay

## 2020-08-23 ENCOUNTER — Ambulatory Visit (INDEPENDENT_AMBULATORY_CARE_PROVIDER_SITE_OTHER): Payer: BLUE CROSS/BLUE SHIELD | Admitting: Adult Health

## 2020-08-23 VITALS — BP 141/96 | HR 73 | Ht 67.0 in | Wt 215.0 lb

## 2020-08-23 DIAGNOSIS — G459 Transient cerebral ischemic attack, unspecified: Secondary | ICD-10-CM

## 2020-08-23 DIAGNOSIS — G43109 Migraine with aura, not intractable, without status migrainosus: Secondary | ICD-10-CM | POA: Diagnosis not present

## 2020-08-23 DIAGNOSIS — Z720 Tobacco use: Secondary | ICD-10-CM

## 2020-08-23 MED ORDER — ATORVASTATIN CALCIUM 40 MG PO TABS: 40.0000 mg | ORAL_TABLET | Freq: Every day | ORAL | 3 refills | Status: DC

## 2020-08-23 MED ORDER — ASPIRIN 81 MG PO TBEC: 81.0000 mg | DELAYED_RELEASE_TABLET | Freq: Every day | ORAL | 3 refills | Status: DC

## 2020-08-23 MED ORDER — PROPRANOLOL HCL ER 80 MG PO CP24: 80.0000 mg | ORAL_CAPSULE | Freq: Every day | ORAL | 3 refills | Status: DC

## 2020-08-23 NOTE — Patient Instructions (Addendum)
Recommend starting propranolol 80mg  daily for migraine prevention and blood pressure -continue to follow with your PCP if additional blood pressure treatment needed  Continue aspirin 81 mg daily  and atorvastatin for secondary stroke prevention  Continue to follow up with PCP regarding cholesterol and blood pressure management  Maintain strict control of hypertension with blood pressure goal below 130/90, diabetes with hemoglobin A1c goal below 6.5% and cholesterol with LDL cholesterol (bad cholesterol) goal below 70 mg/dL.       Followup in the future with me in 6 months or call earlier if needed       Thank you for coming to see [place] at [facility]. I hope we have been able to provide you high quality care today.  You may receive a patient satisfaction survey over the next few weeks. We would appreciate your feedback and comments so that we may continue to improve ourselves and the health of our patients.
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
## 2020-08-28 NOTE — Progress Notes (Signed)
I agree with the above plan 

## 2020-09-06 DIAGNOSIS — J029 Acute pharyngitis, unspecified: Secondary | ICD-10-CM | POA: Diagnosis not present

## 2020-12-05 ENCOUNTER — Other Ambulatory Visit: Payer: Self-pay

## 2020-12-05 ENCOUNTER — Ambulatory Visit (INDEPENDENT_AMBULATORY_CARE_PROVIDER_SITE_OTHER): Payer: BLUE CROSS/BLUE SHIELD | Admitting: Dermatology

## 2020-12-05 DIAGNOSIS — L918 Other hypertrophic disorders of the skin: Secondary | ICD-10-CM

## 2020-12-05 DIAGNOSIS — L308 Other specified dermatitis: Secondary | ICD-10-CM

## 2020-12-05 DIAGNOSIS — L740 Miliaria rubra: Secondary | ICD-10-CM

## 2020-12-05 DIAGNOSIS — L720 Epidermal cyst: Secondary | ICD-10-CM | POA: Diagnosis not present

## 2020-12-05 DIAGNOSIS — Z1283 Encounter for screening for malignant neoplasm of skin: Secondary | ICD-10-CM

## 2020-12-05 MED ORDER — CLOBETASOL PROP EMOLLIENT BASE 0.05 % EX CREA: 1.0000 "application " | TOPICAL_CREAM | Freq: Every day | CUTANEOUS | 5 refills | Status: DC

## 2020-12-21 ENCOUNTER — Other Ambulatory Visit: Payer: Self-pay

## 2020-12-21 ENCOUNTER — Encounter: Payer: Self-pay | Admitting: Dermatology

## 2020-12-21 ENCOUNTER — Ambulatory Visit (INDEPENDENT_AMBULATORY_CARE_PROVIDER_SITE_OTHER): Payer: BLUE CROSS/BLUE SHIELD | Admitting: Dermatology

## 2020-12-21 DIAGNOSIS — L918 Other hypertrophic disorders of the skin: Secondary | ICD-10-CM

## 2020-12-21 DIAGNOSIS — D485 Neoplasm of uncertain behavior of skin: Secondary | ICD-10-CM

## 2020-12-21 DIAGNOSIS — L919 Hypertrophic disorder of the skin, unspecified: Secondary | ICD-10-CM | POA: Diagnosis not present

## 2020-12-21 NOTE — Patient Instructions (Addendum)

## 2020-12-23 ENCOUNTER — Encounter: Payer: Self-pay | Admitting: Dermatology

## 2020-12-24 NOTE — Progress Notes (Signed)
   Follow-Up Visit   Subjective  Ryan Middleton is a 55 y.o. male who presents for the following: Annual Exam (Here to have some cysts looked at on back. Has had them removed before but they have come back. Also skin tag under left eye that is annoying.  History of SCC in situ mid forehead.).  Skin check, several areas to focus on including rash on the arms and cyst on back. Location:  Duration:  Quality:  Associated Signs/Symptoms: Modifying Factors:  Severity:  Timing: Context:   Objective  Well appearing patient in no apparent distress; mood and affect are within normal limits. Waist up skin examination, no atypical pigmented lesions or nonmelanoma skin cancer.  Torso - Posterior (Back) Three 1.5+ cm deep dermal to subcutaneous noninflamed but somewhat fibrotic nodules compatible with cysts with recurrence after previous removal  Head - Anterior (Face) 1 mm pedunculated flesh-colored papule  Left Forearm - Posterior, Right Forearm - Posterior Patchy maculopapular dermatitis, perhaps induced by sun or heat.    All skin waist up examined.   Assessment & Plan    Epidermal cyst Torso - Posterior (Back)  Discussed with the patient and the benign nature of these lesions, the possibility of growth or regarding the lesion.  Angle challenges removing recurrences.  He will decide if he chooses to schedule I hour surgery in the future.  Skin tag Head - Anterior (Face)  Patient may choose to have it removed in future.  Encounter for screening for malignant neoplasm of skin  Annual skin examination, patient encouraged to self examine twice annually.  Continued ultraviolet protection.  Other specified dermatitis Left Forearm - Posterior; Right Forearm - Posterior  Topical clobetasol after bathing for 2 weeks; follow-up by phone at that time.   Related Medications Clobetasol Prop Emollient Base 0.05 % emollient cream Apply 1 application topically at  bedtime.      I, Lavonna Monarch, MD, have reviewed all documentation for this visit.  The documentation on 12/24/20 for the exam, diagnosis, procedures, and orders are all accurate and complete.

## 2021-01-04 ENCOUNTER — Encounter: Payer: Self-pay | Admitting: Dermatology

## 2021-01-04 NOTE — Progress Notes (Signed)
   Follow-Up Visit   Subjective  Ryan Middleton is a 55 y.o. male who presents for the following: Skin Problem (Here to have lesion on left outer eye removed).  Biopsy of eyelid lesion Location:  Duration:  Quality:  Associated Signs/Symptoms: Modifying Factors:  Severity:  Timing: Context:   Objective  Well appearing patient in no apparent distress; mood and affect are within normal limits. Left Lateral Canthus Polypoid 6 mm flesh-colored papule    A focused examination was performed including head and neck. Relevant physical exam findings are noted in the Assessment and Plan.   Assessment & Plan    Neoplasm of uncertain behavior of skin Left Lateral Canthus  Skin / nail biopsy Type of biopsy: tangential   Informed consent: discussed and consent obtained   Timeout: patient name, date of birth, surgical site, and procedure verified   Anesthesia: the lesion was anesthetized in a standard fashion   Anesthetic:  1% lidocaine w/ epinephrine 1-100,000 local infiltration Instrument used: flexible razor blade   Hemostasis achieved with: aluminum chloride and electrodesiccation   Outcome: patient tolerated procedure well   Post-procedure details: wound care instructions given    Specimen 1 - Surgical pathology Differential Diagnosis: tag  Check Margins: No  Alcohol prep using Q-tip, and additional lidocaine anesthesia done 5 mm inferior to the lesion and after 3-minute wait extended to under the lesion.      I, Lavonna Monarch, MD, have reviewed all documentation for this visit.  The documentation on 01/04/21 for the exam, diagnosis, procedures, and orders are all accurate and complete.

## 2021-01-29 DIAGNOSIS — H5712 Ocular pain, left eye: Secondary | ICD-10-CM | POA: Diagnosis not present

## 2021-01-29 DIAGNOSIS — S0502XA Injury of conjunctiva and corneal abrasion without foreign body, left eye, initial encounter: Secondary | ICD-10-CM | POA: Diagnosis not present

## 2021-02-22 ENCOUNTER — Ambulatory Visit (INDEPENDENT_AMBULATORY_CARE_PROVIDER_SITE_OTHER): Payer: BLUE CROSS/BLUE SHIELD | Admitting: Adult Health

## 2021-02-22 ENCOUNTER — Encounter: Payer: Self-pay | Admitting: Adult Health

## 2021-02-22 ENCOUNTER — Other Ambulatory Visit: Payer: Self-pay

## 2021-02-22 VITALS — BP 120/82 | HR 74 | Ht 67.0 in | Wt 210.0 lb

## 2021-02-22 DIAGNOSIS — G43109 Migraine with aura, not intractable, without status migrainosus: Secondary | ICD-10-CM | POA: Diagnosis not present

## 2021-02-22 DIAGNOSIS — G4733 Obstructive sleep apnea (adult) (pediatric): Secondary | ICD-10-CM | POA: Diagnosis not present

## 2021-02-22 DIAGNOSIS — M79601 Pain in right arm: Secondary | ICD-10-CM | POA: Diagnosis not present

## 2021-02-22 DIAGNOSIS — G459 Transient cerebral ischemic attack, unspecified: Secondary | ICD-10-CM | POA: Diagnosis not present

## 2021-02-22 MED ORDER — NURTEC 75 MG PO TBDP: 75.0000 mg | ORAL_TABLET | ORAL | 0 refills | Status: DC | PRN

## 2021-02-22 NOTE — Patient Instructions (Addendum)
Continue aspirin 81 mg daily  and atorvastatin for secondary stroke prevention  Continue to follow up with PCP regarding cholesterol and blood pressure management/monitoring Maintain strict control of blood pressure with blood pressure goal below 130/90 and cholesterol with LDL cholesterol (bad cholesterol) goal below 70 mg/dL.   Continue propranolol 80mg  daily for migraine prophylaxis     Followup in the future with me in 6 months or call earlier if needed     Things to do at home to help migraine headaches Cool Compress. Lie down and place a cool compress on your head.   Avoid headache triggers. If certain foods or odors seem to have triggered your migraines in the past, avoid them. A headache diary might help you identify triggers.   Include physical activity in your daily routine.  Manage stress. Find healthy ways to cope with the stressors, such as delegating tasks on your to-do list.   Practice relaxation techniques. Try deep breathing, yoga, massage and visualization.   Eat regularly. Eating regularly scheduled meals and maintaining a healthy diet might help prevent headaches. Also, drink plenty of fluids.   Follow a regular sleep schedule. Sleep deprivation might contribute to headaches Consider biofeedback. With this mind-body technique, you learn to control certain bodily functions -- such as muscle tension, heart rate and blood pressure -- to prevent headaches or reduce headache pain.     Thank you for coming to see [place] at [facility]. I hope we have been able to provide you high quality care today.  You may receive a patient satisfaction survey over the next few weeks. We would appreciate your feedback and comments so that we may continue to improve ourselves and the health of our patients.
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
## 2021-03-01 ENCOUNTER — Other Ambulatory Visit: Payer: Self-pay

## 2021-03-01 ENCOUNTER — Ambulatory Visit (INDEPENDENT_AMBULATORY_CARE_PROVIDER_SITE_OTHER): Payer: BLUE CROSS/BLUE SHIELD | Admitting: Orthopedic Surgery

## 2021-03-01 ENCOUNTER — Encounter: Payer: Self-pay | Admitting: Orthopedic Surgery

## 2021-03-01 DIAGNOSIS — M25521 Pain in right elbow: Secondary | ICD-10-CM | POA: Diagnosis not present

## 2021-03-01 DIAGNOSIS — M25511 Pain in right shoulder: Secondary | ICD-10-CM

## 2021-03-01 NOTE — Progress Notes (Signed)
Office Visit Note   Patient: Ryan Middleton           Date of Birth: [birth year]           MRN: [id number] Visit Date: 03/01/2021              Requested by: Frann Rider, NP [place],  [place] [id number] PCP: [place]: Visit Diagnoses:  1. Acute pain of right shoulder   2. Pain in right elbow     Plan: Discussed with patient that it seems like his pain is coming from his proximal biceps.  He does describe pain in the right elbow, however, he does have an intact distal biceps tendon based on the hook test.  He has a significantly positive speeds test.  He says his pain starts in the shoulder and radiates distally into the elbow and proximal forearm.  I like to have him see my partner Dr. Sammuel Hines for further evaluation given the likelihood of shoulder injury.  Follow-Up Instructions: No follow-ups on file.   Orders:  No orders of the defined types were placed in this encounter.  No orders of the defined types were placed in this encounter.     Procedures: No procedures performed   Clinical Data: No additional findings.   Subjective: Chief Complaint  Patient presents with   Right Shoulder - New Patient (Initial Visit)    This is a 55 yo RHD M who presents with RUE pain radiating from his shoulder distally into his elbow and proximal forearm.  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  He felt a pop but isn't sure if it was his arm or two ends of the pipe joining together.  Since then, he describes pain along his right arm that is worse w/ pronation/supination, flexion at the elbow, and elevation of the shoulder.  The pain radiates from the shoulder, along the biceps muscle, across the elbow and into the forearm. He had mild shoulder pain previously but nothing similar to this.  He denies any numbness or tingling in the hand.    Review of Systems   Objective: Vital Signs: BP 138/82 (BP Location:  Left Arm, Patient Position: Sitting, Cuff Size: Large)   Pulse 71   Ht 5\' 7"  (1.702 m)   Wt 210 lb (95.3 kg)   SpO2 98%   BMI 32.89 kg/m   Physical Exam Constitutional:      Appearance: Normal appearance.  Cardiovascular:     Rate and Rhythm: Normal rate.     Pulses: Normal pulses.  Skin:    General: Skin is warm and dry.     Capillary Refill: Capillary refill takes less than 2 seconds.  Neurological:     Mental Status: He is alert.    Right Elbow Exam   Tenderness  Right elbow tenderness location: TTP at anterior elbow.   Range of Motion  The patient has normal right elbow ROM.  Other  Sensation: normal Pulse: present  Comments:  Biceps tendon intact on hook test.  Pain w/ resisted flexion or resisted supination.    Right Shoulder Exam   Muscle Strength  Biceps: 3/5   Other  Sensation: normal Pulse: present  Comments:  Significantly positive Speed's test     Specialty Comments:  No specialty comments available.  Imaging: No results found.   PMFS History: Patient Active Problem List   Diagnosis Date Noted  Pain in right shoulder 03/01/2021   Pain in right elbow 03/01/2021   TIA (transient ischemic attack) 07/25/2020   Past Medical History:  Diagnosis Date   Post traumatic stress disorder     History reviewed. No pertinent family history.  Past Surgical History:  Procedure Laterality Date   APPENDECTOMY     FOOT SURGERY     Social History   Occupational History   Not on file  Tobacco Use   Smoking status: Former   Smokeless tobacco: Never  Substance and Sexual Activity   Alcohol use: Yes    Comment: occ   Drug use: No   Sexual activity: Not on file

## 2021-03-05 ENCOUNTER — Other Ambulatory Visit (HOSPITAL_BASED_OUTPATIENT_CLINIC_OR_DEPARTMENT_OTHER): Payer: Self-pay | Admitting: Orthopaedic Surgery

## 2021-03-05 DIAGNOSIS — M25511 Pain in right shoulder: Secondary | ICD-10-CM

## 2021-03-06 ENCOUNTER — Other Ambulatory Visit: Payer: Self-pay

## 2021-03-06 ENCOUNTER — Ambulatory Visit (HOSPITAL_BASED_OUTPATIENT_CLINIC_OR_DEPARTMENT_OTHER)
Admission: RE | Admit: 2021-03-06 | Discharge: 2021-03-06 | Disposition: A | Discharge status: Home / Self Care | Payer: BLUE CROSS/BLUE SHIELD | Source: Ambulatory Visit | Attending: Orthopaedic Surgery | Admitting: Orthopaedic Surgery

## 2021-03-06 ENCOUNTER — Ambulatory Visit (INDEPENDENT_AMBULATORY_CARE_PROVIDER_SITE_OTHER): Payer: BLUE CROSS/BLUE SHIELD | Admitting: Orthopaedic Surgery

## 2021-03-06 DIAGNOSIS — M25521 Pain in right elbow: Secondary | ICD-10-CM

## 2021-03-06 DIAGNOSIS — M25511 Pain in right shoulder: Secondary | ICD-10-CM | POA: Diagnosis not present

## 2021-03-06 DIAGNOSIS — M19011 Primary osteoarthritis, right shoulder: Secondary | ICD-10-CM | POA: Diagnosis not present

## 2021-03-06 DIAGNOSIS — M24129 Other articular cartilage disorders, unspecified elbow: Secondary | ICD-10-CM

## 2021-03-06 DIAGNOSIS — M19021 Primary osteoarthritis, right elbow: Secondary | ICD-10-CM | POA: Diagnosis not present

## 2021-03-06 IMAGING — DX DG ELBOW COMPLETE 3+V*R*
4 series · 4 of 4 positions shown · non-contrast
Comparison: None.

CLINICAL DATA: Pain.

EXAM:
RIGHT SHOULDER - 2+ VIEW; RIGHT ELBOW - COMPLETE 3+ VIEW

[elbow ap]
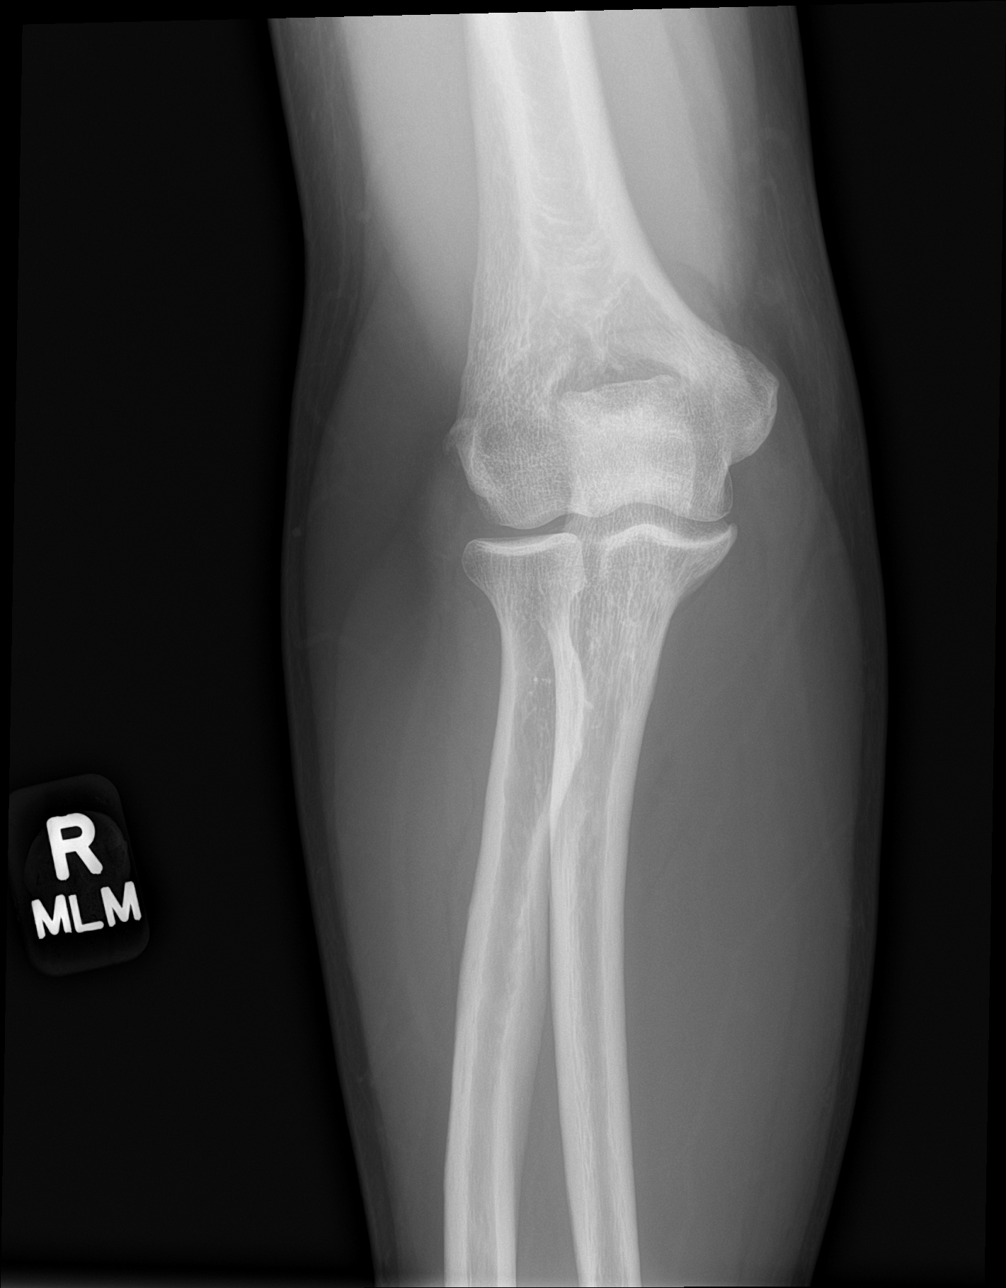

[elbow obl (1 of 2)]
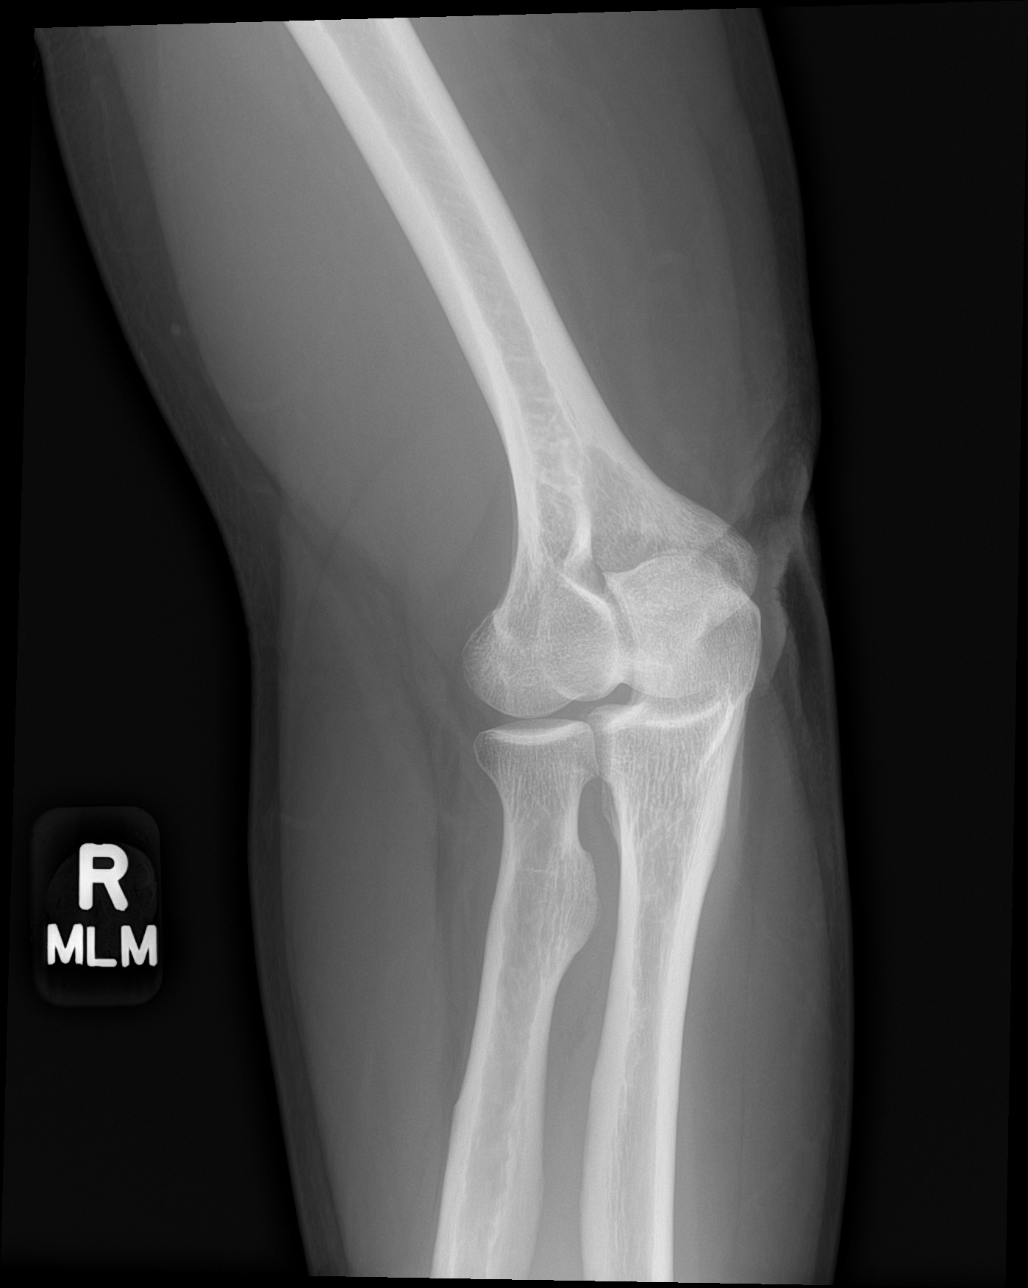

[elbow obl (2 of 2)]
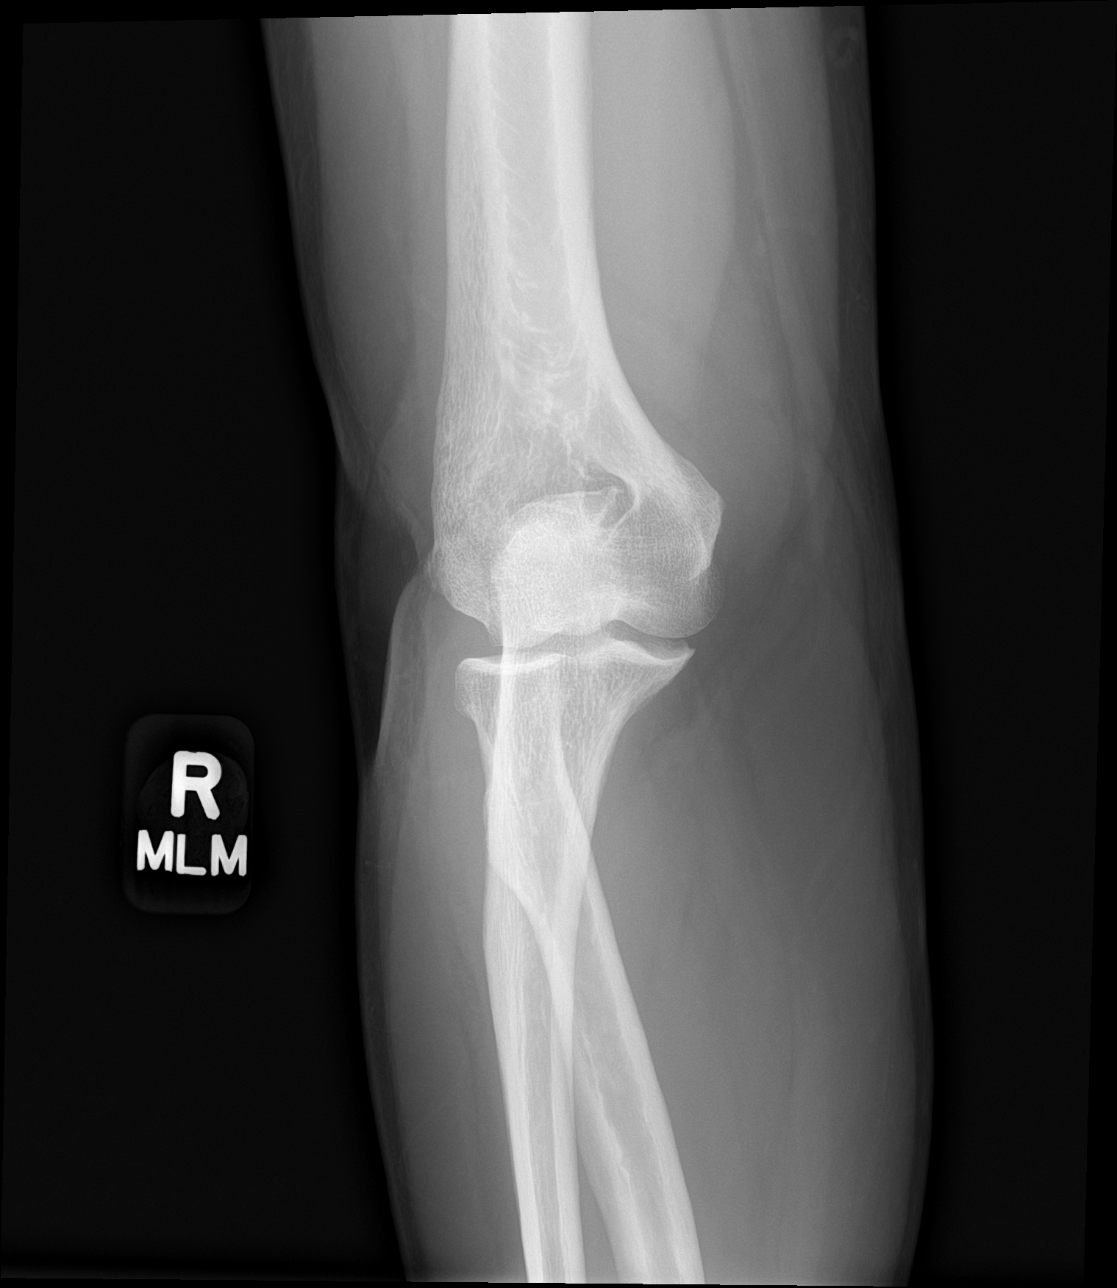

[elbow lat]
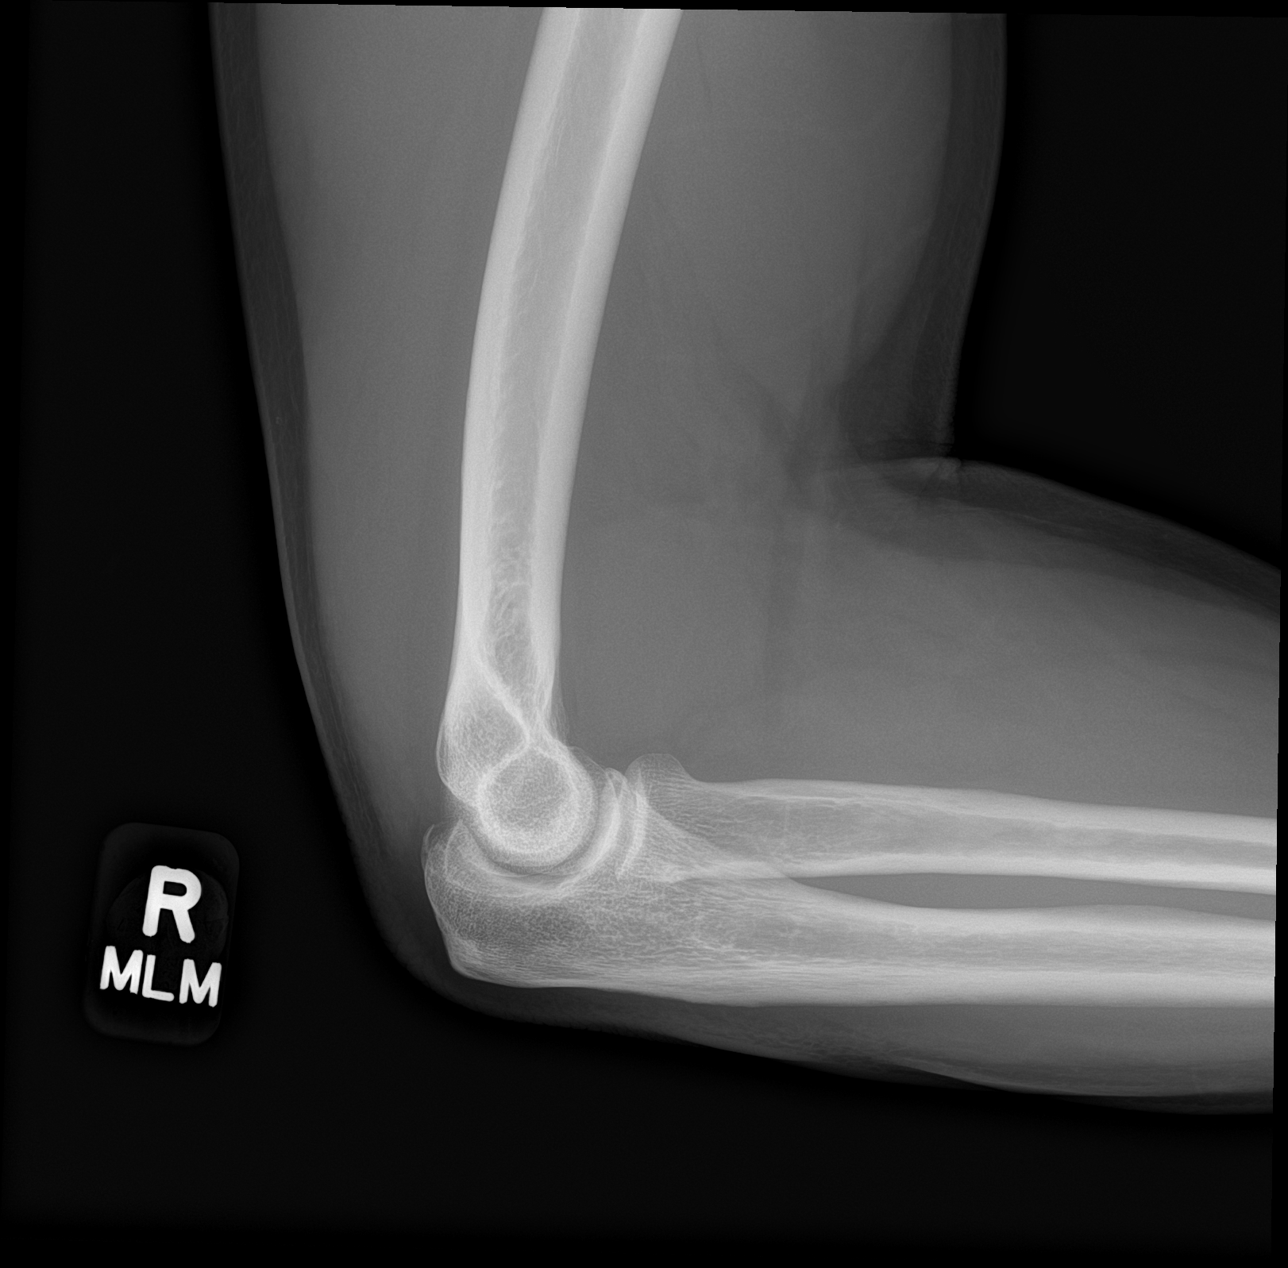

[4 of 4 positions shown; findings below may reference images not displayed]

FINDINGS: Right elbow: There is no evidence of fracture or dislocation. There
is no evidence of arthropathy or other focal bone abnormality. Soft
tissues are unremarkable.

Left shoulder: There is no evidence of fracture or dislocation.
Joint spaces are well maintained. There are mild degenerative
osteophytes of the inferior glenoid. Soft tissues are unremarkable.
IMPRESSION: 1. No acute fracture or dislocation of the right elbow or right
shoulder.
2. Mild degenerative changes of the right shoulder.

## 2021-03-06 IMAGING — DX DG SHOULDER 2+V*R*
3 series · 3 of 3 positions shown · non-contrast
Comparison: None.

CLINICAL DATA: Pain.

EXAM:
RIGHT SHOULDER - 2+ VIEW; RIGHT ELBOW - COMPLETE 3+ VIEW

[shoulder grashey]
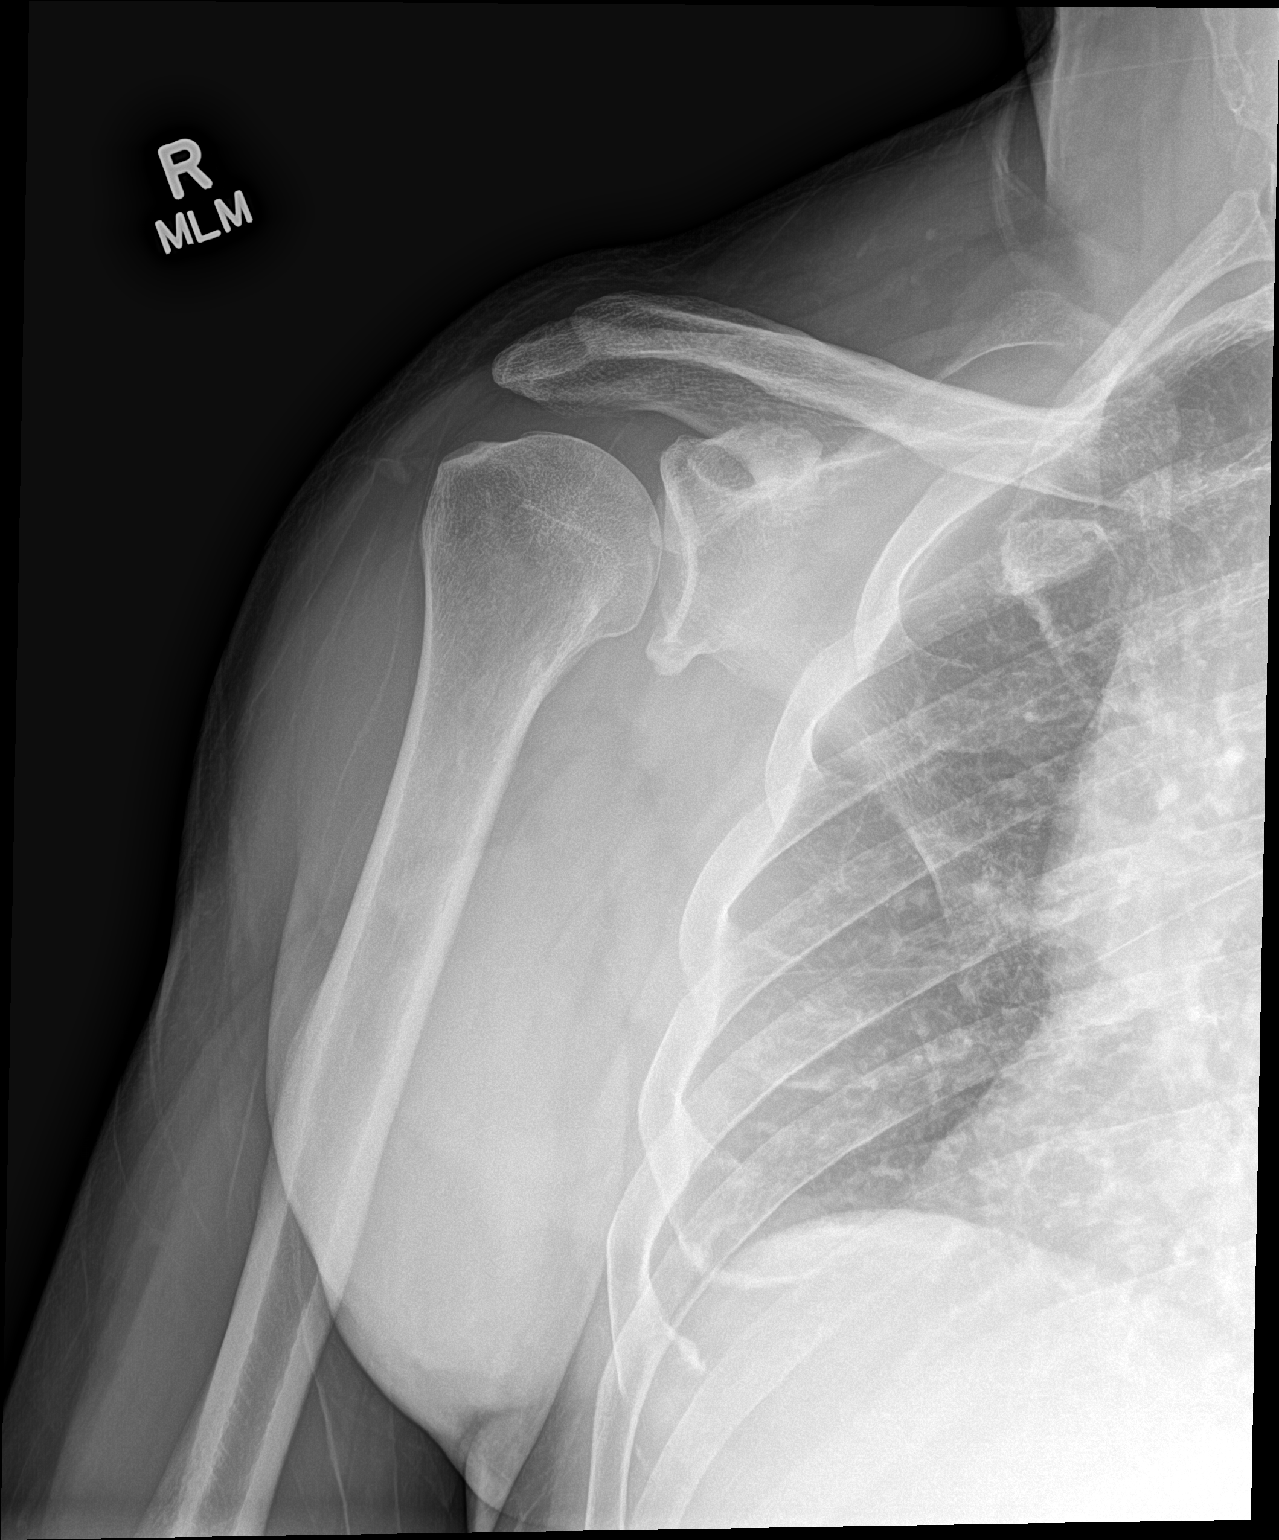

[shoulder y view]
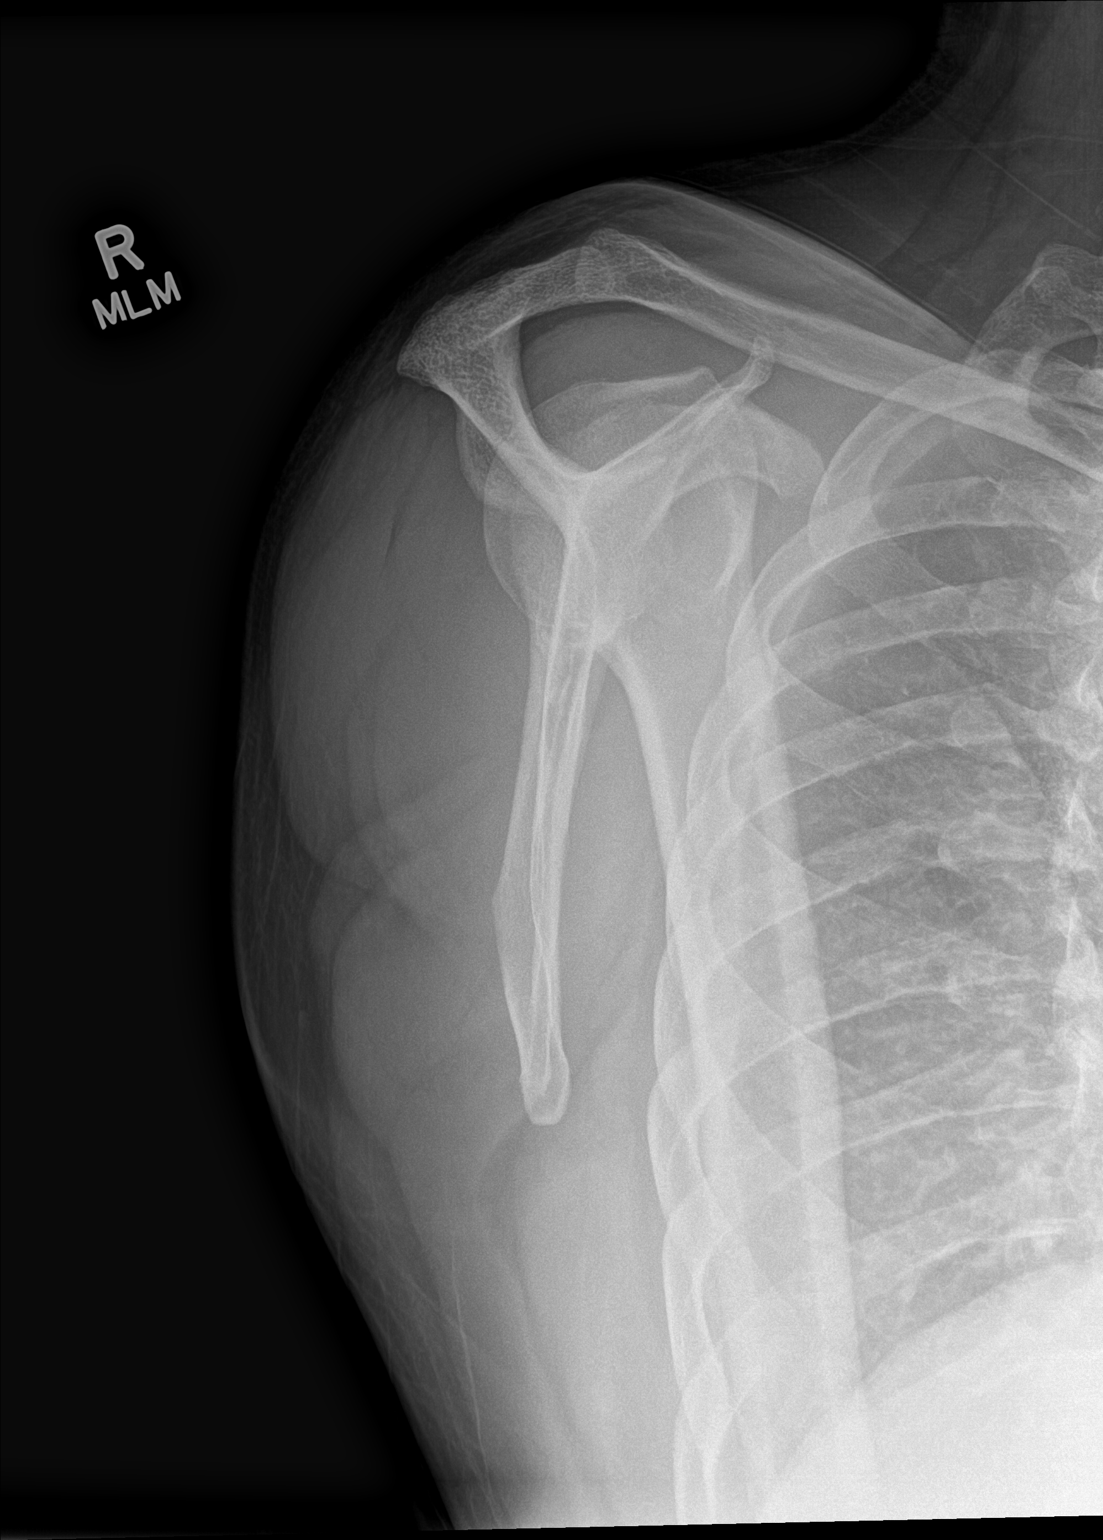

[shoulder axillary]
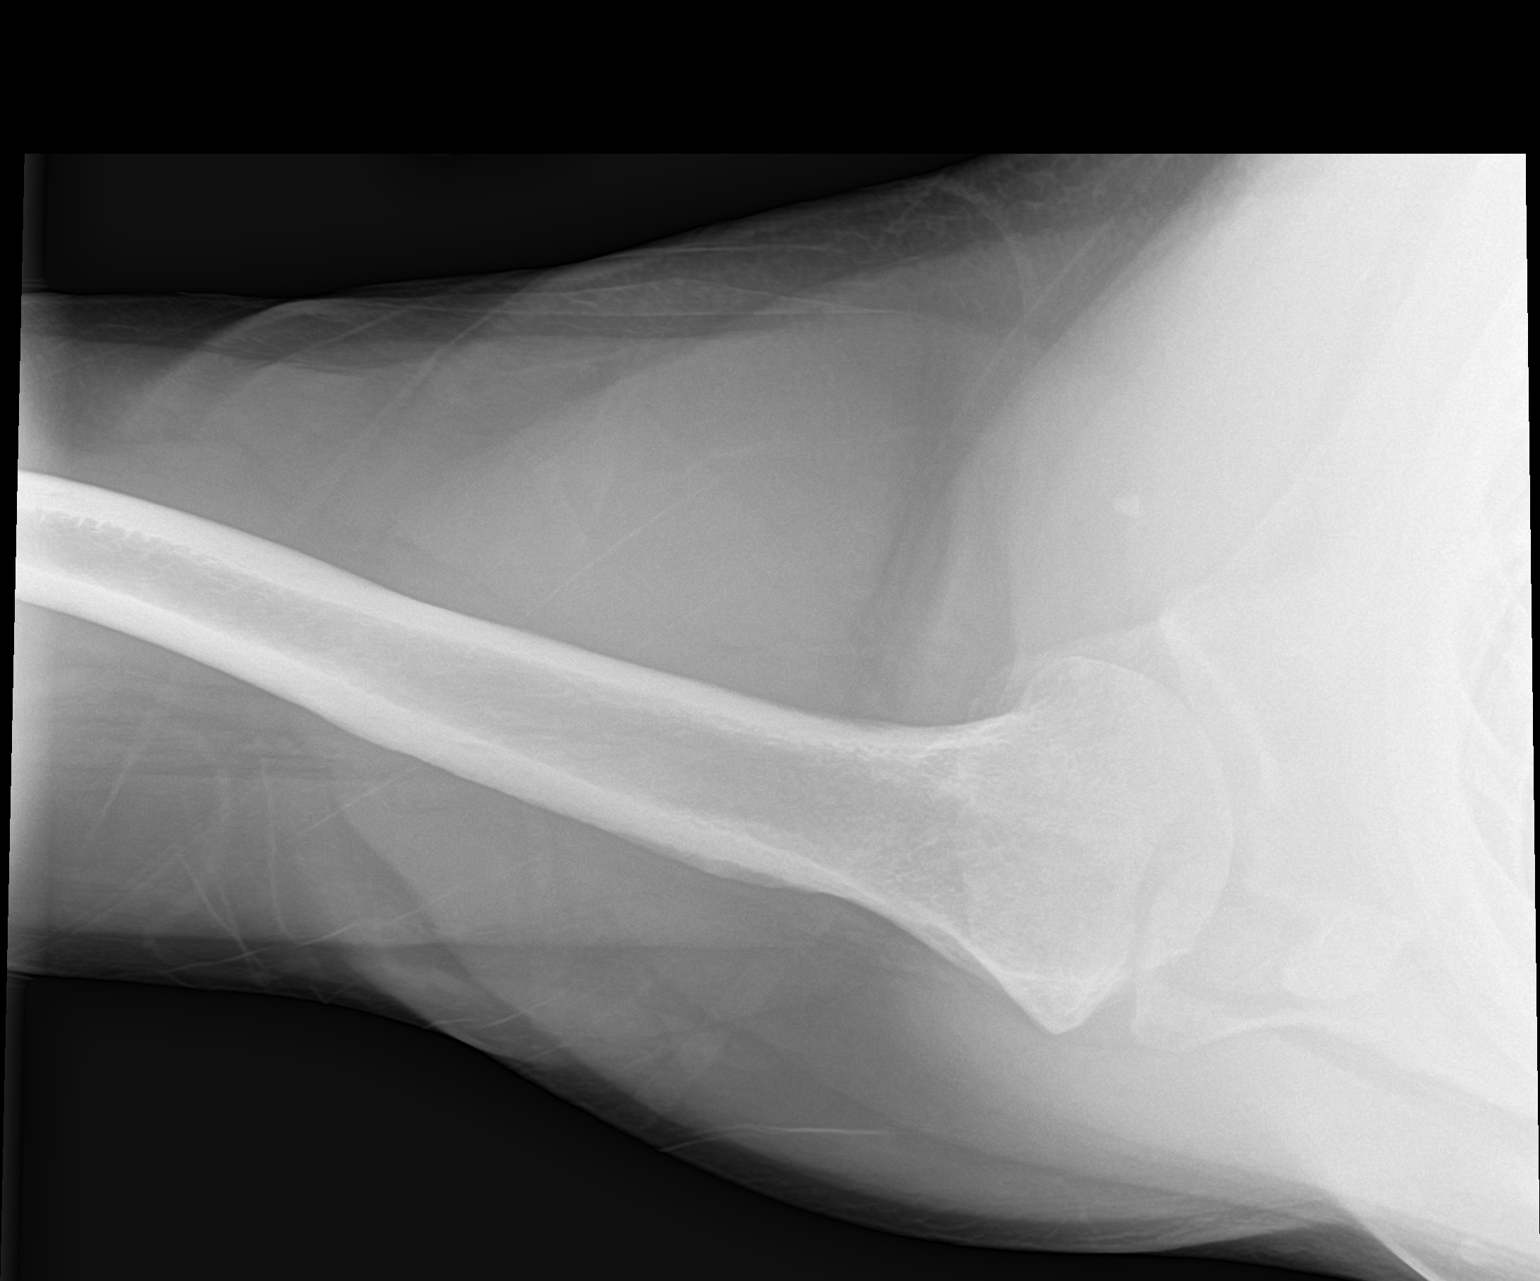

[3 of 3 positions shown; findings below may reference images not displayed]

FINDINGS: Right elbow: There is no evidence of fracture or dislocation. There
is no evidence of arthropathy or other focal bone abnormality. Soft
tissues are unremarkable.

Left shoulder: There is no evidence of fracture or dislocation.
Joint spaces are well maintained. There are mild degenerative
osteophytes of the inferior glenoid. Soft tissues are unremarkable.
IMPRESSION: 1. No acute fracture or dislocation of the right elbow or right
shoulder.
2. Mild degenerative changes of the right shoulder.

## 2021-03-06 NOTE — Progress Notes (Signed)
Chief Complaint: right elbow pain     History of Present Illness:   Ryan Middleton is a 55 y.o. male right elbow pain going on for approximately 3 months.  He states that he did have an injury where he was putting 2 pipes together and immediately felt a give way with immediate pain about the right elbow.  This shocked him significantly and he was unable to use the arm for nearly a day.  He states that the pain is diffuse when it occurs and it does only happen with certain activities like getting up and out of chair pushing a door bar or lifting himself up.  Denies any current shoulder pain that is outside of his typical chronic pain.  He has tried over-the-counter compression sleeves which helped significantly.  Local massage both on the inside and the outside is significantly helpful.  He is not taking anything for pain control.  He continues to work.  He does have a lengthy history in the [place] for over a decade for which she believes he does have several chronic issues including both shoulders.    Surgical History:   None  PMH/PSH/Family History/Social History/Meds/Allergies:    Past Medical History:  Diagnosis Date   Post traumatic stress disorder    Past Surgical History:  Procedure Laterality Date   APPENDECTOMY     FOOT SURGERY     Social History   Socioeconomic History   Marital status: Married    Spouse name: Not on file   Number of children: Not on file   Years of education: Not on file   Highest education level: Not on file  Occupational History   Not on file  Tobacco Use   Smoking status: Former   Smokeless tobacco: Never  Substance and Sexual Activity   Alcohol use: Yes    Comment: occ   Drug use: No   Sexual activity: Not on file  Other Topics Concern   Not on file  Social History Narrative   Not on file   Social Determinants of Health   Financial Resource Strain: Not on file  Food Insecurity: Not on file   Transportation Needs: Not on file  Physical Activity: Not on file  Stress: Not on file  Social Connections: Not on file   No family history on file. Allergies  Allergen Reactions   Hydrocodone Nausea And Vomiting   Simvastatin Rash   Current Outpatient Medications  Medication Sig Dispense Refill   aspirin 81 MG EC tablet Take 1 tablet (81 mg total) by mouth daily. Swallow whole. 90 tablet 3   atorvastatin (LIPITOR) 40 MG tablet Take 1 tablet (40 mg total) by mouth daily. 90 tablet 3   Clobetasol Prop Emollient Base 0.05 % emollient cream Apply 1 application topically at bedtime. 15 g 5   propranolol ER (INDERAL LA) 80 MG 24 hr capsule Take 1 capsule (80 mg total) by mouth daily. 90 capsule 3   QUEtiapine (SEROQUEL) 100 MG tablet Take 100 mg by mouth at bedtime.     Rimegepant Sulfate (NURTEC) 75 MG TBDP Take 75 mg by mouth as needed (For acute migraine relief). 4 tablet 0   No current facility-administered medications for this visit.   No results found.  Review of Systems:   A ROS was performed including pertinent  positives and negatives as documented in the HPI.  Physical Exam :   Constitutional: NAD and appears stated age Neurological: Alert and oriented Psych: Appropriate affect and cooperative There were no vitals taken for this visit.   Comprehensive Musculoskeletal Exam:    Inspection Right Left  Skin No atrophy or gross abnormalities appreciated No atrophy or gross abnormalities appreciated  Palpation    Tenderness None Radial collateral ligament  Crepitus None None  Range of Motion    Flexion (passive) 160 160  Flexion (active) 160 160  Extension 0 0  Pronation normal normal  Supination normal normal   Strength    Flexion  5/5 5/5  Extension 5/5 5/5  Pronation  5/5 5/5  Supination  5/5 5/5  Special Tests    Lateral epicondyle pain with resisted wrist extension Negative Negative  Medial epicondyle pain with resisted wrist flexion  Negative Negative   Tinel test over cubital tunnel  Negative  Negative   Instability    Generalized Laxity No No  Varus stress No instability No instability  Valgus stress  No instability No instability  Reflexes    Triceps Normal (2/4) Normal (2/4)  Brachioradialis Normal (2/4) Normal (2/4)  Biceps Normal (2/4) Normal (2/4)  Neurologic    Fires PIN, radial, median, ulnar, musculocutaneous, axillary, suprascapular, long thoracic, and spinal accessory innervated muscles. No abnormal sensibility.  Vascular/Lymphatic    Radial Pulse 2+ 2+  Cervical Exam    Patient has symmetric cervical range of motion with Negative Spurling's test.   Pain is predominantly about the radial collateral ligament.  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  There appears to be a small coronoid tip fragment consistent with a possible coronoid tip fracture   I personally reviewed and interpreted the radiographs.   Assessment:   55 year old male with predominant radial-based right elbow pain going on for approximately 3 months.  I discussed the differential diagnosis for him including right elbow osteoarthritis, versus right elbow posterior lateral instability, versus radial tunnel syndrome.  I discussed that the last diagnosis is ultimately a diagnosis of exclusion.  His coronoid tip injury on x-ray combined with a traumatic mechanism and lateral based elbow pain is more consistent with a chronic instability.  We also discussed that this is consistent with the types of activities like pushing open a door or door bar or getting up from a chair.  That being said he does have pain along the radial nerve distribution which could speak more towards radial tunnel syndrome.   I have provided him with an ultrasound-guided right elbow injection at today's visit as this will further allow [place] to know how much of his etiology is from an intra-articular source such as osteoarthritis.  I would also like to obtain an MRI as he has been having this pain for 3 months which is significantly affecting his ability to work.  He has failed conservative management and activity restriction at this time.  Plan :    -MRI right elbow -I will see him back in 3-4 weeks in order to discuss results, a that time we will assess the efficacy of the elbow injection  I believe that advance imaging in the form of an MRI is indicated  for the following reasons: -Xrays images were obtained and not diagnostic -The patient has failed treatment modalities including activity restriction, NSAIDs, bracing -The following worrisome symptoms are present on history and exam: Evidence of instability consistent with possible ligamentous injury     I personally saw and evaluated the patient, and participated in the management and treatment plan.  [name], MD Attending Physician, Orthopedic Surgery  This document was dictated using Dragon voice recognition software. A reasonable attempt at proof reading has been made to minimize errors.

## 2021-03-27 ENCOUNTER — Ambulatory Visit (HOSPITAL_BASED_OUTPATIENT_CLINIC_OR_DEPARTMENT_OTHER): Payer: BLUE CROSS/BLUE SHIELD | Admitting: Orthopaedic Surgery

## 2021-04-04 ENCOUNTER — Other Ambulatory Visit: Payer: Self-pay

## 2021-04-04 ENCOUNTER — Ambulatory Visit
Admission: RE | Admit: 2021-04-04 | Discharge: 2021-04-04 | Disposition: A | Discharge status: Home / Self Care | Payer: BLUE CROSS/BLUE SHIELD | Source: Ambulatory Visit | Attending: Orthopaedic Surgery | Admitting: Orthopaedic Surgery

## 2021-04-04 DIAGNOSIS — M25521 Pain in right elbow: Secondary | ICD-10-CM

## 2021-04-04 DIAGNOSIS — M7521 Bicipital tendinitis, right shoulder: Secondary | ICD-10-CM | POA: Diagnosis not present

## 2021-04-04 DIAGNOSIS — S46211A Strain of muscle, fascia and tendon of other parts of biceps, right arm, initial encounter: Secondary | ICD-10-CM | POA: Diagnosis not present

## 2021-04-04 IMAGING — MR MR ELBOW*R* W/O CM
4 of 5 series · 13 of 40 positions shown · non-contrast
Comparison: Radiographs [DATE]

CLINICAL DATA: Right elbow pain and limited range of motion for 4
months. Steroid injection 1 month ago with little relief.

EXAM:
MRI OF THE RIGHT ELBOW WITHOUT CONTRAST
TECHNIQUE: Multiplanar, multisequence MR imaging of the elbow was performed. No
intravenous contrast was administered.

[Series 3: T1 · axial · right · 3.0mm · 0.16mm/px · z∈[-16,+55]mm · 3 of 27 slices shown]
[im 4/27]
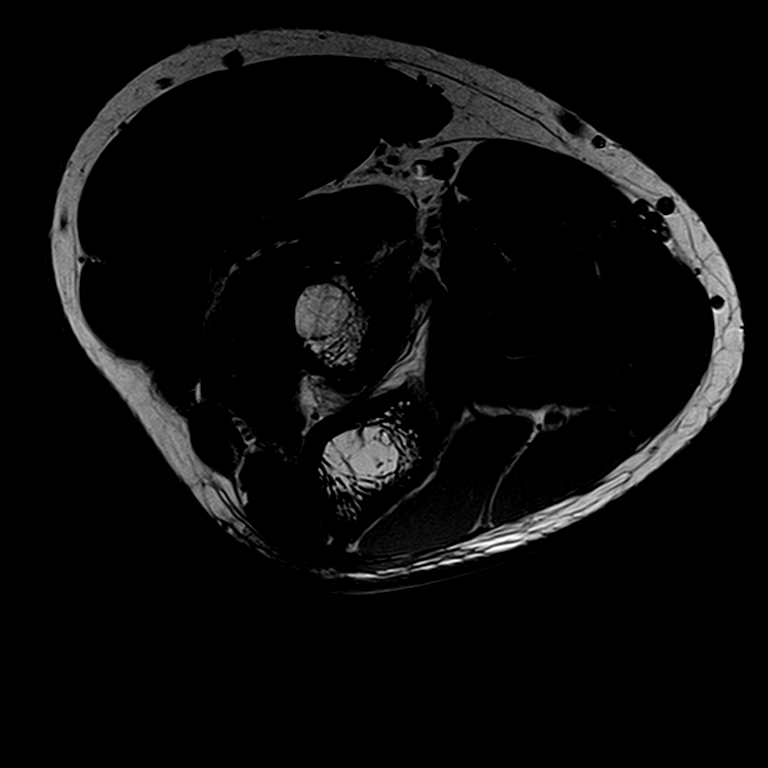
[im 14/27]
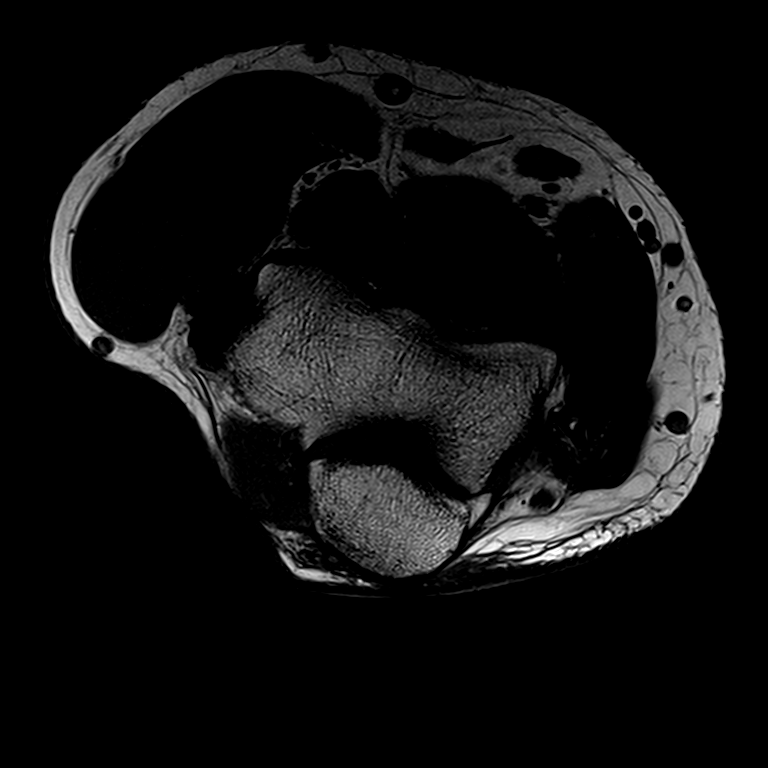
[im 23/27]
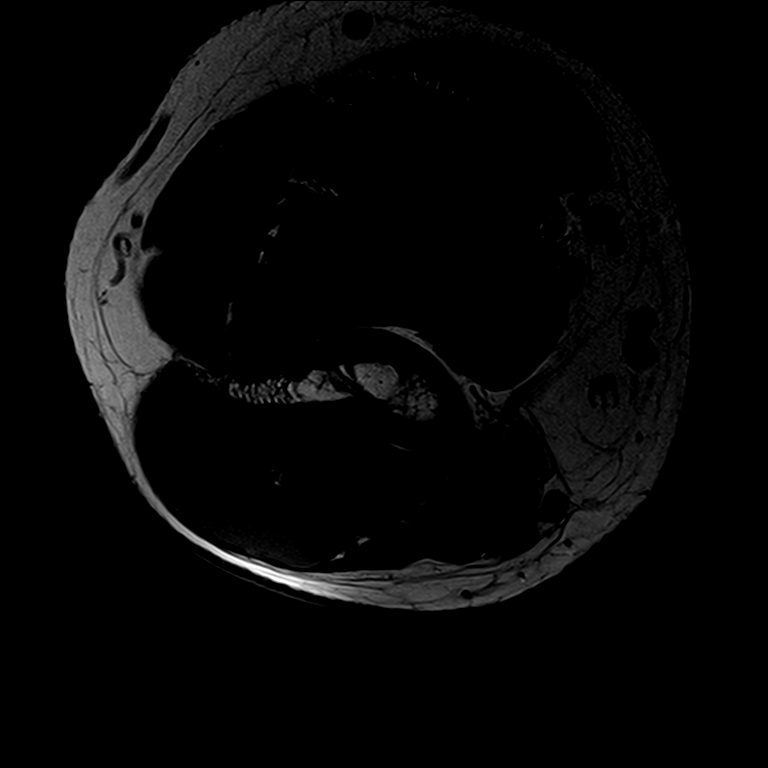

[Series 4: T2 fat-sat · axial · right · 3.0mm · 0.19mm/px · z∈[-24,+58]mm · 4 of 27 slices shown (1 of 3)]
[im 1/27]
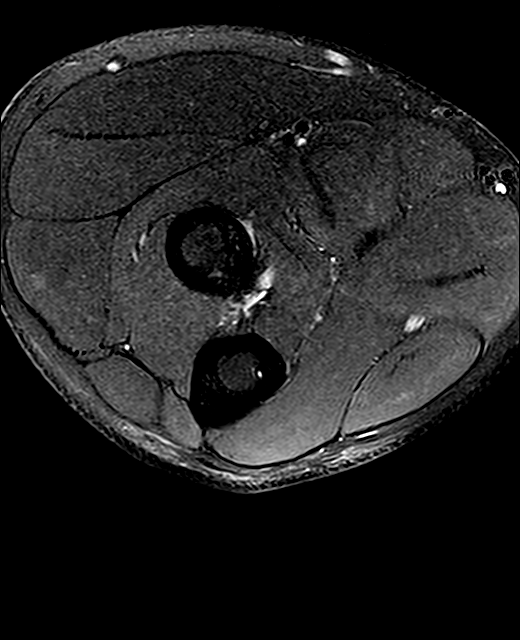
[im 4/27]
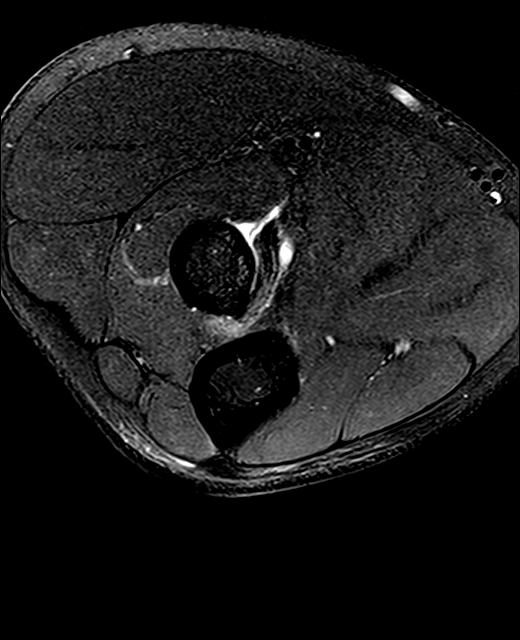
[im 14/27]
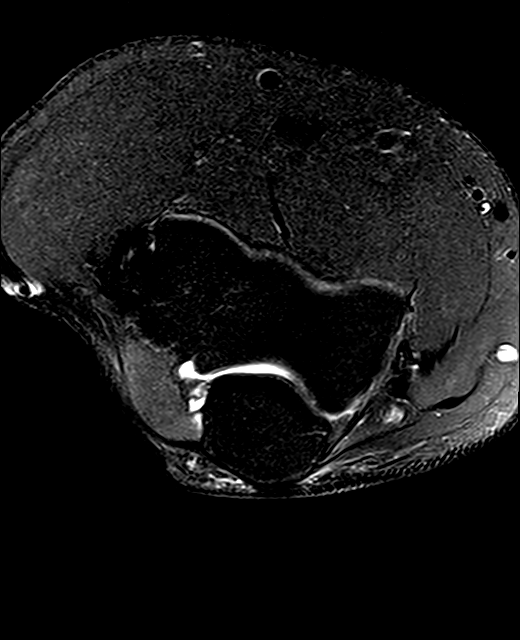
[im 23/27]
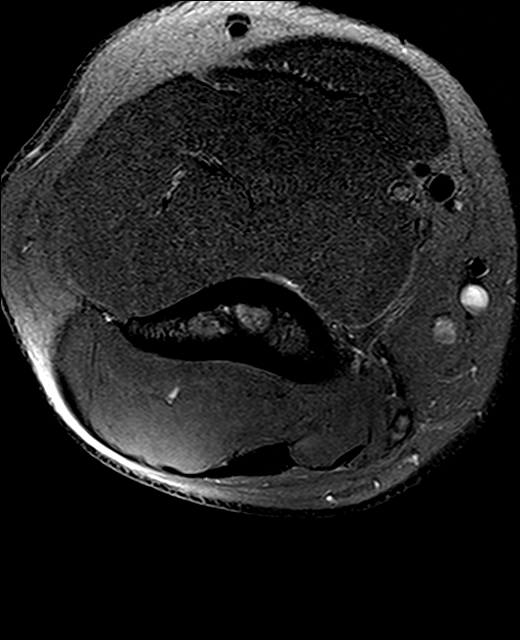

[Series 5: T2 fat-sat · coronal · right · 3.0mm · 0.22mm/px · 3 of 21 slices shown (2 of 3)]
[im 4/21]
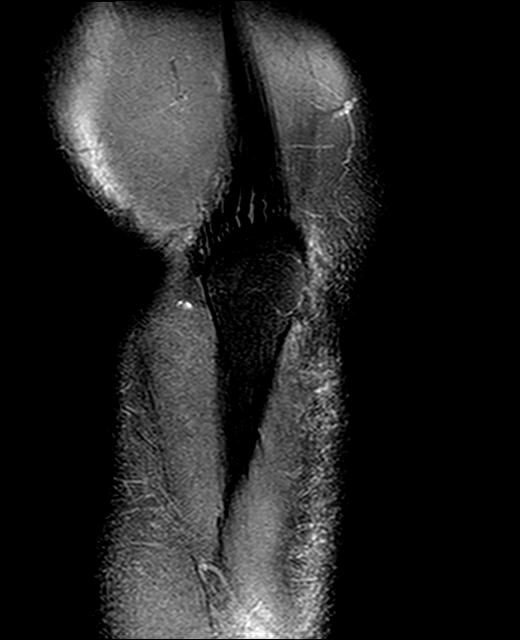
[im 11/21]
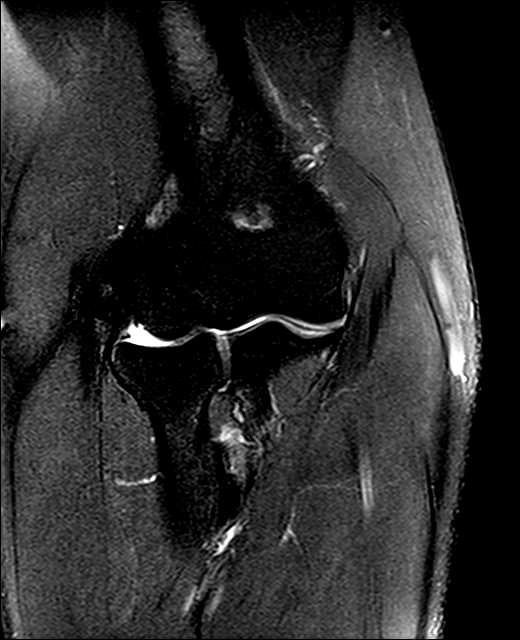
[im 17/21]
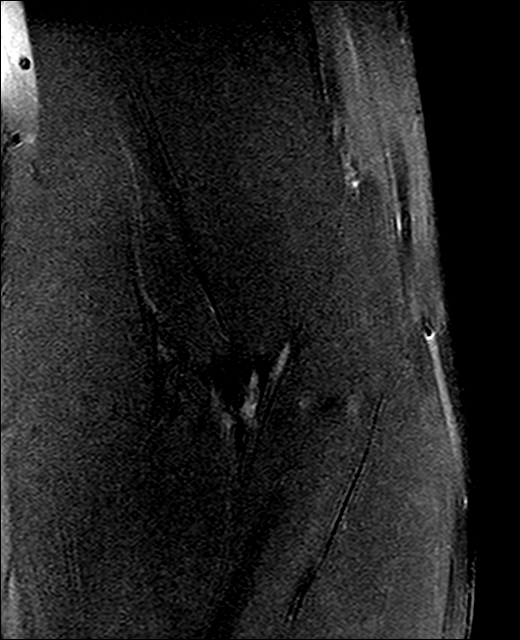

[Series 7: T2 fat-sat · sagittal · right · 3.0mm · 0.22mm/px · 3 of 23 slices shown (3 of 3)]
[im 4/23]
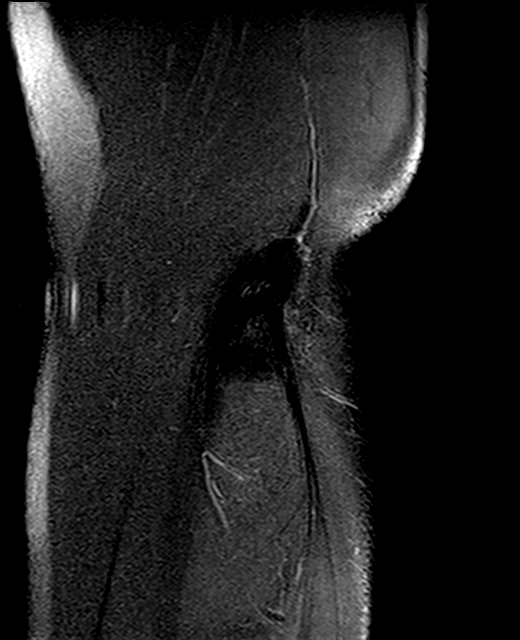
[im 13/23]
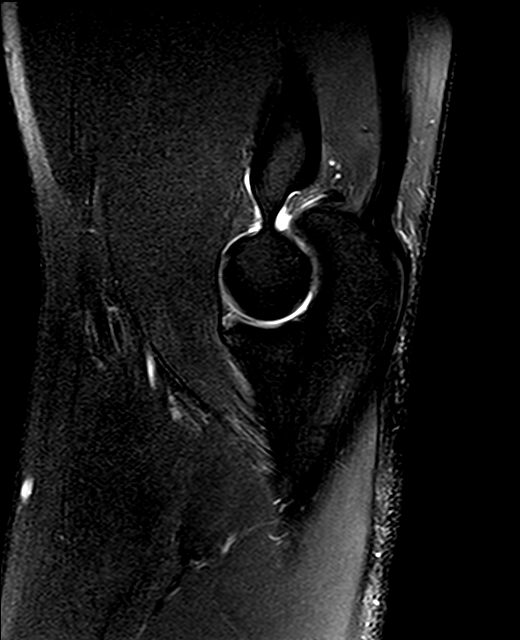
[im 19/23]
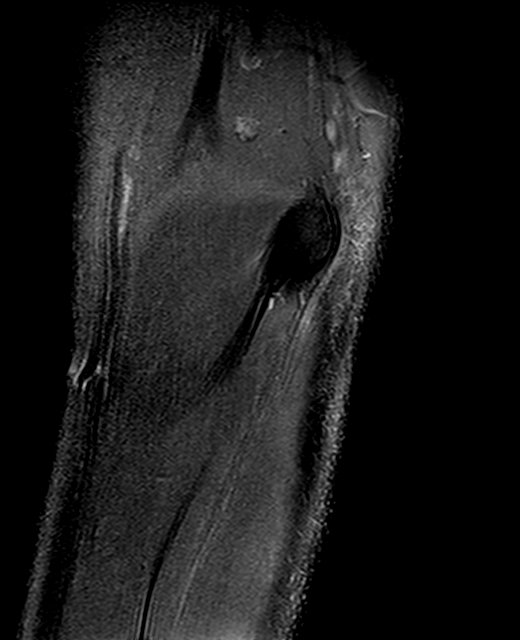

[13 of 40 positions shown; findings below may reference images not displayed]

FINDINGS: TENDONS

Common forearm flexor origin: Intact.

Common forearm extensor origin: The tendon is thickened and
demonstrates increased intrasubstance T2 signal abnormality
consistent with chronic tendinopathy and interstitial tears. No
complete tear/rupture.

Biceps: Significant distal biceps tendon tendinopathy with
interstitial tears and surrounding inflammation/fluid. No complete
tear/rupture.

Triceps: Intact

LIGAMENTS

Medial stabilizers: Intact

Lateral stabilizers:  Intact

Cartilage: No cartilage defects or osteochondral lesion.

Joint: Small amount of joint fluid but no overt joint effusion or
loose bodies.

Cubital tunnel: No mass or mass effect on the ulnar nerve.

Bones: No acute bony findings. No bone lesions or osteochondral
abnormality.
IMPRESSION: 1. Significant distal biceps tendinopathy with interstitial tears
and surrounding inflammation/fluid. No complete tear/rupture.
2. Chronic tendinopathy and interstitial tears involving the common
extensor tendon.
3. Intact medial and lateral collateral ligaments.
4. No significant degenerative changes, joint effusion or loose
bodies.

## 2021-04-10 ENCOUNTER — Other Ambulatory Visit: Payer: Self-pay

## 2021-04-10 ENCOUNTER — Ambulatory Visit (INDEPENDENT_AMBULATORY_CARE_PROVIDER_SITE_OTHER): Payer: BC Managed Care – PPO | Admitting: Orthopaedic Surgery

## 2021-04-10 DIAGNOSIS — S46219A Strain of muscle, fascia and tendon of other parts of biceps, unspecified arm, initial encounter: Secondary | ICD-10-CM

## 2021-04-10 NOTE — Progress Notes (Signed)
Chief Complaint: right elbow pain     History of Present Illness:   04/10/2021: Presents today for MRI follow-up.  He continues to have significant pain with simple activities like steering wheel.  When he does lift with the right arm he notices persistent biceps pain and tendinitis that lingers for hours after.  Ryan Middleton is a 55 y.o. male right elbow pain going on for approximately 3 months.  He states that he did have an injury where he was putting 2 pipes together and immediately felt a give way with immediate pain about the right elbow.  This shocked him significantly and he was unable to use the arm for nearly a day.  He states that the pain is diffuse when it occurs and it does only happen with certain activities like getting up and out of chair pushing a door bar or lifting himself up.  Denies any current shoulder pain that is outside of his typical chronic pain.  He has tried over-the-counter compression sleeves which helped significantly.  Local massage both on the inside and the outside is significantly helpful.  He is not taking anything for pain control.  He continues to work.  He does have a lengthy history in the [place] for over a decade for which she believes he does have several chronic issues including both shoulders.    Surgical History:   None  PMH/PSH/Family History/Social History/Meds/Allergies:    Past Medical History:  Diagnosis Date   Post traumatic stress disorder    Past Surgical History:  Procedure Laterality Date   APPENDECTOMY     FOOT SURGERY     Social History   Socioeconomic History   Marital status: Married    Spouse name: Not on file   Number of children: Not on file   Years of education: Not on file   Highest education level: Not on file  Occupational History   Not on file  Tobacco Use   Smoking status: Former   Smokeless tobacco: Never  Substance and Sexual Activity   Alcohol use: Yes     Comment: occ   Drug use: No   Sexual activity: Not on file  Other Topics Concern   Not on file  Social History Narrative   Not on file   Social Determinants of Health   Financial Resource Strain: Not on file  Food Insecurity: Not on file  Transportation Needs: Not on file  Physical Activity: Not on file  Stress: Not on file  Social Connections: Not on file   No family history on file. Allergies  Allergen Reactions   Hydrocodone Nausea And Vomiting   Simvastatin Rash   Current Outpatient Medications  Medication Sig Dispense Refill   aspirin 81 MG EC tablet Take 1 tablet (81 mg total) by mouth daily. Swallow whole. 90 tablet 3   atorvastatin (LIPITOR) 40 MG tablet Take 1 tablet (40 mg total) by mouth daily. 90 tablet 3   Clobetasol Prop Emollient Base 0.05 % emollient cream Apply 1 application topically at bedtime. 15 g 5   propranolol ER (INDERAL LA) 80 MG 24 hr capsule Take 1 capsule (80 mg total) by mouth daily. 90 capsule 3   QUEtiapine (SEROQUEL) 100 MG tablet Take 100 mg by mouth at bedtime.     Rimegepant Sulfate (NURTEC) 75 MG  TBDP Take 75 mg by mouth as needed (For acute migraine relief). 4 tablet 0   No current facility-administered medications for this visit.   No results found.  Review of Systems:   A ROS was performed including pertinent positives and negatives as documented in the HPI.  Physical Exam :   Constitutional: NAD and appears stated age Neurological: Alert and oriented Psych: Appropriate affect and cooperative There were no vitals taken for this visit.   Comprehensive Musculoskeletal Exam:    Inspection Right Left  Skin No atrophy or gross abnormalities appreciated No atrophy or gross abnormalities appreciated  Palpation    Tenderness None Distal biceps insertion  Crepitus None None  Range of Motion    Flexion (passive) 160 160  Flexion (active) 160 160  Extension 0 0  Pronation normal normal  Supination normal normal   Strength     Flexion  5/5 5/5  Extension 5/5 5/5  Pronation  5/5 5/5  Supination  5/5 5/5  Special Tests    Lateral epicondyle pain with resisted wrist extension Negative Negative  Medial epicondyle pain with resisted wrist flexion  Negative Negative  Tinel test over cubital tunnel  Negative  Negative   Instability    Generalized Laxity No No  Varus stress No instability No instability  Valgus stress  No instability No instability  Reflexes    Triceps Normal (2/4) Normal (2/4)  Brachioradialis Normal (2/4) Normal (2/4)  Biceps Normal (2/4) Normal (2/4)  Neurologic    Fires PIN, radial, median, ulnar, musculocutaneous, axillary, suprascapular, long thoracic, and spinal accessory innervated muscles. No abnormal sensibility.  Vascular/Lymphatic    Radial Pulse 2+ 2+  Cervical Exam    Patient has symmetric cervical range of motion with Negative Spurling's test.   Pain is predominantly about the radial collateral ligament.  Biceps palpable on hook test.  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  There appears to be a small coronoid tip fragment consistent with a possible coronoid tip fracture    MRI right elbow: There is partial tearing of the long head of the distal biceps  I personally reviewed and interpreted the radiographs.   Assessment:   55 year old male with predominant right elbow pain going on for approximately 3 months.  He did have several weeks of relief from the injection of the right elbow although this was transient and is wearing off at this time.  MRI is consistent with a partial biceps tendon tear involving the long head  of the biceps.  Today's exam is very consistent with this.  He continues to have pain that is aggravated with pro supination type activities which is again consistent with a partial biceps tear.  At this time we discussed treatments including activity modification and therapy.  I do not believe that these would ultimately be optimal treatments for him as he is currently very active and is not able to reduce his level of activity given his work.  With regard to more definitive treatments I have discussed PRP into the distal biceps versus surgical debridement and repair.  He would like some additional time to consider both of these options  and will let us know once he is considered Plan :    -He will contact us regarding his decision for definitive treatment     I personally saw and evaluated the patient, and participated in the management and treatment plan.  [name], MD Attending Physician, Orthopedic Surgery  This document was dictated using Dragon voice recognition software. A reasonable attempt at proof reading has been made to minimize errors.

## 2021-04-10 NOTE — H&P (View-Only) (Signed)
Chief Complaint: right elbow pain     History of Present Illness:   04/10/2021: Presents today for MRI follow-up.  He continues to have significant pain with simple activities like steering wheel.  When he does lift with the right arm he notices persistent biceps pain and tendinitis that lingers for hours after.  Ryan Middleton is a 55 y.o. male right elbow pain going on for approximately 3 months.  He states that he did have an injury where he was putting 2 pipes together and immediately felt a give way with immediate pain about the right elbow.  This shocked him significantly and he was unable to use the arm for nearly a day.  He states that the pain is diffuse when it occurs and it does only happen with certain activities like getting up and out of chair pushing a door bar or lifting himself up.  Denies any current shoulder pain that is outside of his typical chronic pain.  He has tried over-the-counter compression sleeves which helped significantly.  Local massage both on the inside and the outside is significantly helpful.  He is not taking anything for pain control.  He continues to work.  He does have a lengthy history in the [place] for over a decade for which she believes he does have several chronic issues including both shoulders.    Surgical History:   None  PMH/PSH/Family History/Social History/Meds/Allergies:    Past Medical History:  Diagnosis Date   Post traumatic stress disorder    Past Surgical History:  Procedure Laterality Date   APPENDECTOMY     FOOT SURGERY     Social History   Socioeconomic History   Marital status: Married    Spouse name: Not on file   Number of children: Not on file   Years of education: Not on file   Highest education level: Not on file  Occupational History   Not on file  Tobacco Use   Smoking status: Former   Smokeless tobacco: Never  Substance and Sexual Activity   Alcohol use: Yes     Comment: occ   Drug use: No   Sexual activity: Not on file  Other Topics Concern   Not on file  Social History Narrative   Not on file   Social Determinants of Health   Financial Resource Strain: Not on file  Food Insecurity: Not on file  Transportation Needs: Not on file  Physical Activity: Not on file  Stress: Not on file  Social Connections: Not on file   No family history on file. Allergies  Allergen Reactions   Hydrocodone Nausea And Vomiting   Simvastatin Rash   Current Outpatient Medications  Medication Sig Dispense Refill   aspirin 81 MG EC tablet Take 1 tablet (81 mg total) by mouth daily. Swallow whole. 90 tablet 3   atorvastatin (LIPITOR) 40 MG tablet Take 1 tablet (40 mg total) by mouth daily. 90 tablet 3   Clobetasol Prop Emollient Base 0.05 % emollient cream Apply 1 application topically at bedtime. 15 g 5   propranolol ER (INDERAL LA) 80 MG 24 hr capsule Take 1 capsule (80 mg total) by mouth daily. 90 capsule 3   QUEtiapine (SEROQUEL) 100 MG tablet Take 100 mg by mouth at bedtime.     Rimegepant Sulfate (NURTEC) 75 MG  TBDP Take 75 mg by mouth as needed (For acute migraine relief). 4 tablet 0   No current facility-administered medications for this visit.   No results found.  Review of Systems:   A ROS was performed including pertinent positives and negatives as documented in the HPI.  Physical Exam :   Constitutional: NAD and appears stated age Neurological: Alert and oriented Psych: Appropriate affect and cooperative There were no vitals taken for this visit.   Comprehensive Musculoskeletal Exam:    Inspection Right Left  Skin No atrophy or gross abnormalities appreciated No atrophy or gross abnormalities appreciated  Palpation    Tenderness None Distal biceps insertion  Crepitus None None  Range of Motion    Flexion (passive) 160 160  Flexion (active) 160 160  Extension 0 0  Pronation normal normal  Supination normal normal   Strength     Flexion  5/5 5/5  Extension 5/5 5/5  Pronation  5/5 5/5  Supination  5/5 5/5  Special Tests    Lateral epicondyle pain with resisted wrist extension Negative Negative  Medial epicondyle pain with resisted wrist flexion  Negative Negative  Tinel test over cubital tunnel  Negative  Negative   Instability    Generalized Laxity No No  Varus stress No instability No instability  Valgus stress  No instability No instability  Reflexes    Triceps Normal (2/4) Normal (2/4)  Brachioradialis Normal (2/4) Normal (2/4)  Biceps Normal (2/4) Normal (2/4)  Neurologic    Fires PIN, radial, median, ulnar, musculocutaneous, axillary, suprascapular, long thoracic, and spinal accessory innervated muscles. No abnormal sensibility.  Vascular/Lymphatic    Radial Pulse 2+ 2+  Cervical Exam    Patient has symmetric cervical range of motion with Negative Spurling's test.   Pain is predominantly about the radial collateral ligament.  Biceps palpable on hook test.  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  There appears to be a small coronoid tip fragment consistent with a possible coronoid tip fracture    MRI right elbow: There is partial tearing of the long head of the distal biceps  I personally reviewed and interpreted the radiographs.   Assessment:   55 year old male with predominant right elbow pain going on for approximately 3 months.  He did have several weeks of relief from the injection of the right elbow although this was transient and is wearing off at this time.  MRI is consistent with a partial biceps tendon tear involving the long head  of the biceps.  Today's exam is very consistent with this.  He continues to have pain that is aggravated with pro supination type activities which is again consistent with a partial biceps tear.  At this time we discussed treatments including activity modification and therapy.  I do not believe that these would ultimately be optimal treatments for him as he is currently very active and is not able to reduce his level of activity given his work.  With regard to more definitive treatments I have discussed PRP into the distal biceps versus surgical debridement and repair.  He would like some additional time to consider both of these options  and will let us know once he is considered Plan :    -He will contact us regarding his decision for definitive treatment     I personally saw and evaluated the patient, and participated in the management and treatment plan.  [name], MD Attending Physician, Orthopedic Surgery  This document was dictated using Dragon voice recognition software. A reasonable attempt at proof reading has been made to minimize errors.

## 2021-04-12 ENCOUNTER — Other Ambulatory Visit (HOSPITAL_BASED_OUTPATIENT_CLINIC_OR_DEPARTMENT_OTHER): Payer: Self-pay

## 2021-04-12 ENCOUNTER — Other Ambulatory Visit (HOSPITAL_BASED_OUTPATIENT_CLINIC_OR_DEPARTMENT_OTHER): Payer: Self-pay | Admitting: Orthopaedic Surgery

## 2021-04-12 ENCOUNTER — Ambulatory Visit (HOSPITAL_BASED_OUTPATIENT_CLINIC_OR_DEPARTMENT_OTHER): Payer: Self-pay | Admitting: Orthopaedic Surgery

## 2021-04-12 DIAGNOSIS — S46219A Strain of muscle, fascia and tendon of other parts of biceps, unspecified arm, initial encounter: Secondary | ICD-10-CM

## 2021-04-12 MED ORDER — OXYCODONE HCL 5 MG PO TABS
5.0000 mg | ORAL_TABLET | ORAL | 0 refills | Status: DC | PRN
  Filled 2021-04-12: qty 20, 4d supply, fill #0

## 2021-04-12 MED ORDER — ACETAMINOPHEN 500 MG PO TABS
500.0000 mg | ORAL_TABLET | Freq: Three times a day (TID) | ORAL | 0 refills | Status: DC
  Filled 2021-04-12: qty 30, 10d supply, fill #0

## 2021-04-12 MED ORDER — ASPIRIN EC 325 MG PO TBEC
325.0000 mg | DELAYED_RELEASE_TABLET | Freq: Every day | ORAL | 0 refills | Status: DC
  Filled 2021-04-12: qty 30, 30d supply, fill #0

## 2021-04-12 MED ORDER — IBUPROFEN 800 MG PO TABS
800.0000 mg | ORAL_TABLET | Freq: Three times a day (TID) | ORAL | 0 refills | Status: DC
  Filled 2021-04-12: qty 30, 10d supply, fill #0

## 2021-04-19 ENCOUNTER — Encounter (HOSPITAL_COMMUNITY): Payer: Self-pay | Admitting: Orthopaedic Surgery

## 2021-04-19 ENCOUNTER — Other Ambulatory Visit: Payer: Self-pay

## 2021-04-19 NOTE — Progress Notes (Signed)
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 Sleeps sitting up and does not wear it consistently   ERAS Protcol - eras until 1220pm  COVID TEST- ambulatory surgery  Anesthesia review: no  Patient verbally denies any shortness of breath, fever, cough and chest pain during phone call   -------------  SDW INSTRUCTIONS given:  Your procedure is scheduled on 04/20/21.  Report to [facility] Main Entrance "A" at 1:00 P.M., and check in at the Admitting office.  Call this number if you have problems the morning of surgery:  [phone]   Remember:  Do not eat after midnight the night before your surgery  You may drink clear liquids until 12:20pm the day of your surgery.   Clear liquids allowed are: Water, Non-Citrus Juices (without pulp), Carbonated Beverages, Clear Tea, Black Coffee Only, and Gatorade    Take these medicines the morning of surgery with A SIP OF WATER  Tylenol if needed Lipitor propanolol  As of today, STOP taking any Aspirin (unless otherwise instructed by your surgeon) Aleve, Naproxen, Ibuprofen, Motrin, Advil, Goody's, BC's, all herbal medications, fish oil, and all vitamins.                      Do not wear jewelry, make up, or nail polish            Do not wear lotions, powders, perfumes/colognes, or deodorant.            Do not shave 48 hours prior to surgery.  Men may shave face and neck.            Do not bring valuables to the hospital.            [facility] is not responsible for any belongings or valuables.  Do NOT Smoke (Tobacco/Vaping) or drink Alcohol 24 hours prior to your procedure If you use a CPAP at night, you may bring all equipment for your overnight stay.   Contacts, glasses, dentures or bridgework may not be worn into surgery.      For patients admitted to the  hospital, discharge time will be determined by your treatment team.   Patients discharged the day of surgery will not be allowed to drive home, and someone needs to stay with them for 24 hours.    Special instructions:   [facility]- Preparing For Surgery  Before surgery, you can play an important role. Because skin is not sterile, your skin needs to be as free of germs as possible. You can reduce the number of germs on your skin by washing with CHG (chlorahexidine gluconate) Soap before surgery.  CHG is an antiseptic cleaner which kills germs and bonds with the skin to continue killing germs even after washing.    Oral Hygiene is also important to reduce your risk of infection.  Remember - BRUSH YOUR TEETH THE MORNING OF SURGERY WITH YOUR REGULAR TOOTHPASTE  Please do not use if you have an allergy to CHG or antibacterial soaps. If your skin becomes reddened/irritated stop using the CHG.  Do not shave (including legs and underarms) for at least 48 hours prior to first CHG shower. It is OK to shave your face.  Please follow these instructions carefully.   Shower the [employer] SURGERY and the  MORNING OF SURGERY with DIAL Soap.   Pat yourself dry with a CLEAN TOWEL.  Wear CLEAN PAJAMAS to bed the night before surgery  Place CLEAN SHEETS on your bed the night of your first shower and DO NOT SLEEP WITH PETS.   Day of Surgery: Please shower morning of surgery  Wear Clean/Comfortable clothing the morning of surgery Do not apply any deodorants/lotions.   Remember to brush your teeth WITH YOUR REGULAR TOOTHPASTE.   Questions were answered. Patient verbalized understanding of instructions.

## 2021-04-20 ENCOUNTER — Ambulatory Visit (HOSPITAL_COMMUNITY): Payer: BC Managed Care – PPO | Admitting: Anesthesiology

## 2021-04-20 ENCOUNTER — Other Ambulatory Visit (HOSPITAL_BASED_OUTPATIENT_CLINIC_OR_DEPARTMENT_OTHER): Payer: Self-pay

## 2021-04-20 ENCOUNTER — Ambulatory Visit (HOSPITAL_COMMUNITY)
Admission: RE | Admit: 2021-04-20 | Discharge: 2021-04-20 | Disposition: A | Discharge status: Home / Self Care | Payer: BC Managed Care – PPO | Attending: Orthopaedic Surgery | Admitting: Orthopaedic Surgery

## 2021-04-20 ENCOUNTER — Encounter (HOSPITAL_COMMUNITY): Payer: Self-pay | Admitting: Orthopaedic Surgery

## 2021-04-20 ENCOUNTER — Encounter (HOSPITAL_COMMUNITY)
Admission: RE | Disposition: A | Discharge status: Home / Self Care | Payer: Self-pay | Source: Home / Self Care | Attending: Orthopaedic Surgery

## 2021-04-20 DIAGNOSIS — S46211A Strain of muscle, fascia and tendon of other parts of biceps, right arm, initial encounter: Secondary | ICD-10-CM | POA: Insufficient documentation

## 2021-04-20 DIAGNOSIS — Y9389 Activity, other specified: Secondary | ICD-10-CM | POA: Insufficient documentation

## 2021-04-20 DIAGNOSIS — S46219A Strain of muscle, fascia and tendon of other parts of biceps, unspecified arm, initial encounter: Secondary | ICD-10-CM

## 2021-04-20 DIAGNOSIS — E669 Obesity, unspecified: Secondary | ICD-10-CM | POA: Diagnosis not present

## 2021-04-20 DIAGNOSIS — I10 Essential (primary) hypertension: Secondary | ICD-10-CM | POA: Diagnosis not present

## 2021-04-20 DIAGNOSIS — Z6832 Body mass index (BMI) 32.0-32.9, adult: Secondary | ICD-10-CM | POA: Diagnosis not present

## 2021-04-20 DIAGNOSIS — I252 Old myocardial infarction: Secondary | ICD-10-CM | POA: Diagnosis not present

## 2021-04-20 DIAGNOSIS — G4730 Sleep apnea, unspecified: Secondary | ICD-10-CM | POA: Diagnosis not present

## 2021-04-20 DIAGNOSIS — X58XXXA Exposure to other specified factors, initial encounter: Secondary | ICD-10-CM | POA: Diagnosis not present

## 2021-04-20 DIAGNOSIS — Z87891 Personal history of nicotine dependence: Secondary | ICD-10-CM | POA: Insufficient documentation

## 2021-04-20 HISTORY — PX: DISTAL BICEPS TENDON REPAIR: SHX1461

## 2021-04-20 HISTORY — DX: Essential (primary) hypertension: I10

## 2021-04-20 HISTORY — DX: Cerebral infarction, unspecified: I63.9

## 2021-04-20 HISTORY — DX: Sleep apnea, unspecified: G47.30

## 2021-04-20 LAB — BASIC METABOLIC PANEL
Anion gap: 6 (ref 5–15)
BUN: 7 mg/dL (ref 6–20)
CO2: 23 mmol/L (ref 22–32)
Calcium: 9.3 mg/dL (ref 8.9–10.3)
Chloride: 108 mmol/L (ref 98–111)
Creatinine, Ser: 1.12 mg/dL (ref 0.61–1.24)
GFR, Estimated: >60 mL/min (ref >60)
Glucose, Bld: 86 mg/dL (ref 70–99)
Potassium: 3.9 mmol/L (ref 3.5–5.1)
Sodium: 137 mmol/L (ref 135–145)

## 2021-04-20 LAB — CBC
HCT: 47.7 % (ref 39.0–52.0)
Hemoglobin: 17 g/dL (ref 13.0–17.0)
MCH: 33.5 pg (ref 26.0–34.0)
MCHC: 35.6 g/dL (ref 30.0–36.0)
MCV: 94.1 fL (ref 80.0–100.0)
Platelets: 209 10*3/uL (ref 150–400)
RBC: 5.07 MIL/uL (ref 4.22–5.81)
RDW: 12.5 % (ref 11.5–15.5)
WBC: 10.3 10*3/uL (ref 4.0–10.5)
nRBC: 0 % (ref 0.0–0.2)

## 2021-04-20 SURGERY — REPAIR, TENDON, BICEPS, DISTAL
Anesthesia: Monitor Anesthesia Care | Site: Arm Upper | Laterality: Right

## 2021-04-20 MED ORDER — FENTANYL CITRATE (PF) 100 MCG/2ML IJ SOLN
25.0000 ug | INTRAMUSCULAR | Status: DC | PRN
Start: 2021-04-20 — End: 2021-04-21

## 2021-04-20 MED ORDER — PROPOFOL 10 MG/ML IV BOLUS
INTRAVENOUS | Status: DC | PRN
  Administered 2021-04-20: 80 mg via INTRAVENOUS
  Administered 2021-04-20: 20 mg via INTRAVENOUS

## 2021-04-20 MED ORDER — FENTANYL CITRATE (PF) 100 MCG/2ML IJ SOLN
INTRAMUSCULAR | Status: AC
  Administered 2021-04-20: 100 ug via INTRAVENOUS
  Filled 2021-04-20: qty 2

## 2021-04-20 MED ORDER — VANCOMYCIN HCL 1000 MG IV SOLR
INTRAVENOUS | Status: AC
  Filled 2021-04-20: qty 20

## 2021-04-20 MED ORDER — LIDOCAINE 2% (20 MG/ML) 5 ML SYRINGE
INTRAMUSCULAR | Status: DC | PRN
  Administered 2021-04-20: 80 mg via INTRAVENOUS
  Administered 2021-04-20: 20 mg via INTRAVENOUS

## 2021-04-20 MED ORDER — CEFAZOLIN SODIUM-DEXTROSE 2-4 GM/100ML-% IV SOLN
2.0000 g | INTRAVENOUS | Status: AC
  Administered 2021-04-20: 2 g via INTRAVENOUS
  Filled 2021-04-20: qty 100

## 2021-04-20 MED ORDER — GABAPENTIN 300 MG PO CAPS
300.0000 mg | ORAL_CAPSULE | Freq: Once | ORAL | Status: AC
  Administered 2021-04-20: 300 mg via ORAL
  Filled 2021-04-20: qty 1

## 2021-04-20 MED ORDER — CHLORHEXIDINE GLUCONATE 0.12 % MT SOLN
15.0000 mL | Freq: Once | OROMUCOSAL | Status: AC
  Administered 2021-04-20: 15 mL via OROMUCOSAL
  Filled 2021-04-20: qty 15

## 2021-04-20 MED ORDER — CLONIDINE HCL (ANALGESIA) 100 MCG/ML EP SOLN
EPIDURAL | Status: DC | PRN
  Administered 2021-04-20: 100 ug

## 2021-04-20 MED ORDER — GLYCOPYRROLATE PF 0.2 MG/ML IJ SOSY
PREFILLED_SYRINGE | INTRAMUSCULAR | Status: DC | PRN
  Administered 2021-04-20: .2 mg via INTRAVENOUS

## 2021-04-20 MED ORDER — FENTANYL CITRATE (PF) 250 MCG/5ML IJ SOLN
INTRAMUSCULAR | Status: AC
  Filled 2021-04-20: qty 5

## 2021-04-20 MED ORDER — MIDAZOLAM HCL 2 MG/2ML IJ SOLN
INTRAMUSCULAR | Status: AC
  Filled 2021-04-20: qty 2

## 2021-04-20 MED ORDER — ONDANSETRON HCL 4 MG/2ML IJ SOLN
INTRAMUSCULAR | Status: DC | PRN
  Administered 2021-04-20: 4 mg via INTRAVENOUS

## 2021-04-20 MED ORDER — BUPIVACAINE-EPINEPHRINE (PF) 0.5% -1:200000 IJ SOLN
INTRAMUSCULAR | Status: DC | PRN
  Administered 2021-04-20: 30 mL via PERINEURAL

## 2021-04-20 MED ORDER — PROPOFOL 10 MG/ML IV BOLUS
INTRAVENOUS | Status: AC
  Filled 2021-04-20: qty 20

## 2021-04-20 MED ORDER — DEXAMETHASONE SODIUM PHOSPHATE 10 MG/ML IJ SOLN
INTRAMUSCULAR | Status: DC | PRN
  Administered 2021-04-20: 10 mg via INTRAVENOUS

## 2021-04-20 MED ORDER — FENTANYL CITRATE (PF) 100 MCG/2ML IJ SOLN: 100.0000 ug | Freq: Once | INTRAMUSCULAR | Status: AC

## 2021-04-20 MED ORDER — VANCOMYCIN HCL 1000 MG IV SOLR
INTRAVENOUS | Status: DC | PRN
  Administered 2021-04-20: 1000 mg

## 2021-04-20 MED ORDER — PROMETHAZINE HCL 25 MG/ML IJ SOLN
6.2500 mg | INTRAMUSCULAR | Status: DC | PRN
Start: 2021-04-20 — End: 2021-04-21

## 2021-04-20 MED ORDER — LACTATED RINGERS IV SOLN: INTRAVENOUS | Status: DC

## 2021-04-20 MED ORDER — ORAL CARE MOUTH RINSE: 15.0000 mL | Freq: Once | OROMUCOSAL | Status: AC

## 2021-04-20 MED ORDER — HYDROMORPHONE HCL 2 MG PO TABS
2.0000 mg | ORAL_TABLET | Freq: Two times a day (BID) | ORAL | 0 refills | Status: DC | PRN
  Filled 2021-04-20: qty 20, 10d supply, fill #0

## 2021-04-20 MED ORDER — ACETAMINOPHEN 500 MG PO TABS
1000.0000 mg | ORAL_TABLET | Freq: Once | ORAL | Status: AC
  Administered 2021-04-20: 1000 mg via ORAL
  Filled 2021-04-20: qty 2

## 2021-04-20 MED ORDER — TRANEXAMIC ACID-NACL 1000-0.7 MG/100ML-% IV SOLN
1000.0000 mg | INTRAVENOUS | Status: AC
  Administered 2021-04-20: 1000 mg via INTRAVENOUS
  Filled 2021-04-20: qty 100

## 2021-04-20 MED ORDER — MIDAZOLAM HCL 2 MG/2ML IJ SOLN
INTRAMUSCULAR | Status: AC
  Administered 2021-04-20: 2 mg via INTRAVENOUS
  Filled 2021-04-20: qty 2

## 2021-04-20 MED ORDER — PROPOFOL 500 MG/50ML IV EMUL
INTRAVENOUS | Status: DC | PRN
  Administered 2021-04-20: 120 ug/kg/min via INTRAVENOUS

## 2021-04-20 MED ORDER — 0.9 % SODIUM CHLORIDE (POUR BTL) OPTIME
TOPICAL | Status: DC | PRN
  Administered 2021-04-20: 500 mL

## 2021-04-20 MED ORDER — MIDAZOLAM HCL 2 MG/2ML IJ SOLN: 2.0000 mg | Freq: Once | INTRAMUSCULAR | Status: AC

## 2021-04-20 SURGICAL SUPPLY — 51 items
BAG COUNTER SPONGE SURGICOUNT (BAG) ×2 IMPLANT
BAG SURGICOUNT SPONGE COUNTING (BAG) ×1
BNDG COHESIVE 6X5 TAN ST LF (GAUZE/BANDAGES/DRESSINGS) ×2 IMPLANT
BNDG ELASTIC 4X5.8 VLCR STR LF (GAUZE/BANDAGES/DRESSINGS) ×2 IMPLANT
BNDG ELASTIC 6X5.8 VLCR STR LF (GAUZE/BANDAGES/DRESSINGS) ×2 IMPLANT
CLOSURE WOUND 1/2 X4 (GAUZE/BANDAGES/DRESSINGS) ×1
CORD BIPOLAR FORCEPS 12FT (ELECTRODE) ×3 IMPLANT
CUFF TOURN SGL QUICK 18X4 (TOURNIQUET CUFF) ×3 IMPLANT
DEPRESSOR TONGUE BLADE STERILE (MISCELLANEOUS) ×3 IMPLANT
DERMABOND ADVANCED (GAUZE/BANDAGES/DRESSINGS) ×2
DERMABOND ADVANCED .7 DNX12 (GAUZE/BANDAGES/DRESSINGS) IMPLANT
DRAPE IMP U-DRAPE 54X76 (DRAPES) ×3 IMPLANT
DRAPE OEC MINIVIEW 54X84 (DRAPES) ×3 IMPLANT
DRAPE SURG 17X23 STRL (DRAPES) ×3 IMPLANT
ELECT BLADE 4.0 EZ CLEAN MEGAD (MISCELLANEOUS) ×3
ELECT REM PT RETURN 9FT ADLT (ELECTROSURGICAL) ×3
ELECTRODE BLDE 4.0 EZ CLN MEGD (MISCELLANEOUS) ×1 IMPLANT
ELECTRODE REM PT RTRN 9FT ADLT (ELECTROSURGICAL) ×1 IMPLANT
GAUZE XEROFORM 1X8 LF (GAUZE/BANDAGES/DRESSINGS) ×2 IMPLANT
GLOVE SRG 8 PF TXTR STRL LF DI (GLOVE) ×1 IMPLANT
GLOVE SURG ENC MOIS LTX SZ7 (GLOVE) ×3 IMPLANT
GLOVE SURG ENC MOIS LTX SZ7.5 (GLOVE) ×3 IMPLANT
GLOVE SURG UNDER POLY LF SZ7 (GLOVE) ×3 IMPLANT
GLOVE SURG UNDER POLY LF SZ8 (GLOVE) ×3
GOWN STRL REUS W/ TWL LRG LVL3 (GOWN DISPOSABLE) ×2 IMPLANT
GOWN STRL REUS W/ TWL XL LVL3 (GOWN DISPOSABLE) ×1 IMPLANT
GOWN STRL REUS W/TWL LRG LVL3 (GOWN DISPOSABLE) ×6
GOWN STRL REUS W/TWL XL LVL3 (GOWN DISPOSABLE) ×3
KIT BASIN OR (CUSTOM PROCEDURE TRAY) ×3 IMPLANT
NDL HYPO 25X1 1.5 SAFETY (NEEDLE) IMPLANT
NEEDLE HYPO 25X1 1.5 SAFETY (NEEDLE) IMPLANT
NS IRRIG 1000ML POUR BTL (IV SOLUTION) ×3 IMPLANT
PACK ORTHO EXTREMITY (CUSTOM PROCEDURE TRAY) ×3 IMPLANT
PAD CAST 4YDX4 CTTN HI CHSV (CAST SUPPLIES) IMPLANT
PADDING CAST COTTON 4X4 STRL (CAST SUPPLIES) ×3
PADDING CAST COTTON 6X4 STRL (CAST SUPPLIES) ×2 IMPLANT
PENCIL BUTTON HOLSTER BLD 10FT (ELECTRODE) ×3 IMPLANT
SLING ARM FOAM STRAP LRG (SOFTGOODS) ×2 IMPLANT
SPONGE T-LAP 4X18 ~~LOC~~+RFID (SPONGE) ×3 IMPLANT
STRIP CLOSURE SKIN 1/2X4 (GAUZE/BANDAGES/DRESSINGS) ×1 IMPLANT
SUT FIBERWIRE #2 38 T-5 BLUE (SUTURE) ×3
SUT MNCRL AB 3-0 PS2 27 (SUTURE) ×4 IMPLANT
SUTURE FIBERWR #2 38 T-5 BLUE (SUTURE) IMPLANT
SYR CONTROL 10ML LL (SYRINGE) IMPLANT
SYS FBRTK BUTTON 2.6 (Anchor) ×9 IMPLANT
SYSTEM FBRTK BUTTON 2.6 (Anchor) IMPLANT
TOWEL GREEN STERILE FF (TOWEL DISPOSABLE) ×3 IMPLANT
TOWEL OR NON WOVEN STRL DISP B (DISPOSABLE) ×3 IMPLANT
TUBE CONNECTING 20'X1/4 (TUBING) ×1
TUBE CONNECTING 20X1/4 (TUBING) ×2 IMPLANT
UNDERPAD 30X36 HEAVY ABSORB (UNDERPADS AND DIAPERS) ×3 IMPLANT

## 2021-04-20 NOTE — Op Note (Signed)
Date of Surgery: 04/20/2021  INDICATIONS: 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  The risk and benefits of the procedure with discussed in detail and documented in the pre-operative evaluation.  PREOPERATIVE DIAGNOSIS: 1. Right distal biceps tendon partial tear  POSTOPERATIVE DIAGNOSIS: Same.  PROCEDURE: 1. Right distal biceps repair   SURGEON: Yevonne Pax MD  ASSISTANT: Raynelle Fanning, ATC; necessary for the timely completion of procedure and due to complexity of procedure.  ANESTHESIA:  MAC + nerve block  IV FLUIDS AND URINE: See anesthesia record.  ANTIBIOTICS: Ancef 2g  ESTIMATED BLOOD LOSS: 20 mL.  IMPLANTS:  Implant Name Type Inv. Item Serial No. Manufacturer Lot No. LRB No. Used Action  SYS FBRTK BUTTON 2.6 - [id number] Anchor SYS FBRTK BUTTON 2.6  [employer] [id number] Right 1 Implanted  SYS FBRTK BUTTON 2.6 - [id number] Anchor SYS FBRTK BUTTON 2.6  [employer] [id number] Right 2 Implanted    DRAINS: None  CULTURES: None  COMPLICATIONS: none  DESCRIPTION OF PROCEDURE:  I identified the patient in the pre-operative holding area.  I marked the operative right elbow with my initials. I reviewed the risks and benefits of the proposed surgical intervention and the patient (and/or patient's guardian) wished to proceed.  No regional block was performed to facilitate post-operative neurologic check. The patient was transferred to the operative suite and placed in the supine position with an arm board with all bony prominences padded.     SCDs were placed on bilateral lower extremity. Appropriate antibiotics was administered within 1 hour before incision. The operative extremity was then prepped and draped in standard fashion. A time out was performed confirming the correct extremity, correct patient and correct procedure.   Throughout the case, the arm was kept in supination to protect the PIN nerve. A  longitudinal incision was made across the volar aspect of the forearm approximately 3 cm distal to the elbow crease after localizing the biceps tuberosity under c-arm. A Henry approach was used to identify the biceps tendon which was in place with partial tearing of the short head of the tendon with interstitial degeneration. A fiber loop at 9m from the end and taken to the distal end of the short head of the distal biceps was placed.   Next, careful dissection was taken to avoid injuring the lateral antebrachial cutaneous nerve and also to avoid the crossing veins and arterial structures. The dissection was then taken down using blunt dissection and the biceps was followed down to its insertion to the biceps tuberosity.    Appropriate localization and position of the pin was confirmed with fluoroscopy. A fiberbutton was then placed under directly visualization and confirmed on fluoroscopy. The free ends of the suture were then based through the button and tensioned. This was tied back through itself. This was done at 30 degrees of extension   The wound was irrigated thoroughly. 2g of Vancomycin powder was placed. The wounds were closed with 3-0 Monocryl and then a running 3-0 Monocryl running stich. The wounds were dressed with steri-strips, xeroform, 4x4s, webril, and button wrap. The patient was placed in a splint and shoulder immobilizer    KRaynelle FanningATC was necessary for opening, closing, retracting, limb positioning and overall facilitation and timely completion of the procedure.     POSTOPERATIVE PLAN: He will be nonweightbearing on the right arm for 2 weeks. I will see him back and remove the splint and perform a wound  check. He will be seen by PT following that.  Yevonne Pax, MD 5:23 PM

## 2021-04-20 NOTE — Anesthesia Procedure Notes (Signed)
Anesthesia Regional Block: Supraclavicular block   Pre-Anesthetic Checklist: , timeout performed,  Correct Patient, Correct Site, Correct Laterality,  Correct Procedure, Correct Position, site marked,  Risks and benefits discussed,  Surgical consent,  Pre-op evaluation,  At surgeon's request and post-op pain management  Laterality: Right  Prep: chloraprep       Needles:  Injection technique: Single-shot  Needle Type: Echogenic Needle     Needle Length: 9cm  Needle Gauge: 21     Additional Needles:   Procedures:,,,, ultrasound used (permanent image in chart),,    Narrative:  Start time: 04/20/2021 1:50 PM End time: 04/20/2021 1:58 PM Injection made incrementally with aspirations every 5 mL.  Performed by: Personally  Anesthesiologist: Catalina Gravel, MD  Additional Notes: No pain on injection. No increased resistance to injection. Injection made in 5cc increments.  Good needle visualization.  Patient tolerated procedure well.

## 2021-04-20 NOTE — Interval H&P Note (Signed)
History and Physical Interval Note:  04/20/2021 3:51 PM  Ryan Middleton  has presented today for surgery, with the diagnosis of right partial biceps tendon repair.  The various methods of treatment have been discussed with the patient and family. After consideration of risks, benefits and other options for treatment, the patient has consented to  Procedure(s): RIGHT DISTAL BICEPS REPAIR (Right) as a surgical intervention.  The patient's history has been reviewed, patient examined, no change in status, stable for surgery.  I have reviewed the patient's chart and labs.  Questions were answered to the patient's satisfaction.     [name]

## 2021-04-20 NOTE — Brief Op Note (Signed)
° °  Brief Op Note  Date of Surgery: 04/20/2021  Preoperative Diagnosis: right partial biceps tendon repair  Postoperative Diagnosis: same  Procedure: Procedure(s): RIGHT DISTAL BICEPS REPAIR  Implants: Implant Name Type Inv. Item Serial No. Manufacturer Lot No. LRB No. Used Action  SYS FBRTK BUTTON 2.6 - [id number] Anchor SYS FBRTK BUTTON 2.6  [employer] [id number] Right 1 Implanted  SYS FBRTK BUTTON 2.6 - [id number] Anchor SYS FBRTK BUTTON 2.6  [employer] [id number] Right 2 Implanted    Surgeons: Surgeon(s): [name], MD  Anesthesia: Regional    Estimated Blood Loss: See anesthesia record  Complications: None  Condition to PACU: Stable  Yevonne Pax, MD 04/20/2021 5:23 PM

## 2021-04-20 NOTE — Discharge Instructions (Signed)
° ° °   Discharge Instructions    Attending Surgeon: [name], MD Office Phone Number: [phone]   Diagnosis and Procedures:    Surgeries Performed: Right elbow distal biceps repair  Discharge Plan:    Diet: Resume usual diet. Begin with light or bland foods.  Drink plenty of fluids.  Activity:  Keep sling and dressing in place until your follow up visit in Physical Therapy You are advised to go home directly from the hospital or surgical center. Restrict your activities.  GENERAL INSTRUCTIONS: 1.  Keep your surgical site elevated above your heart for at least 5-7 days or longer to prevent swelling. This will improve your comfort and your overall recovery following surgery.     2. Please call Dr. Eddie Dibbles office at [phone] with questions Monday-Friday during business hours. If no one answers, please leave a message and someone should get back to the patient within 24 hours. For emergencies please call 911 or proceed to the emergency room.   3. Patient to notify surgical team if experiences any of the following: Bowel/Bladder dysfunction, uncontrolled pain, nerve/muscle weakness, incision with increased drainage or redness, nausea/vomiting and Fever greater than 101.0 F.  Be alert for signs of infection including redness, streaking, odor, fever or chills. Be alert for excessive pain or bleeding and notify your surgeon immediately.  WOUND INSTRUCTIONS:   Leave your dressing/cast/splint in place until your post operative visit.  Keep it clean and dry.  Always keep the incision clean and dry until the staples/sutures are removed. If there is no drainage from the incision you should keep it open to air. If there is drainage from the incision you must keep it covered at all times until the drainage stops  Do not soak in a bath tub, hot tub, pool, lake or other body of water until 21 days after your surgery and your incision is completely dry and healed.  If you have  removable sutures (or staples) they must be removed 10-14 days (unless otherwise instructed) from the day of your surgery.     1)  Elevate the extremity as much as possible.  2)  Keep the dressing clean and dry.  3)  Please call us if the dressing becomes wet or dirty.  4)  If you are experiencing worsening pain or worsening swelling, please call.     MEDICATIONS: Resume all previous home medications at the previous prescribed dose and frequency unless otherwise noted Start taking the  pain medications on an as-needed basis as prescribed  Please taper down pain medication over the next week following surgery.  Ideally you should not require a refill of any narcotic pain medication.  Take pain medication with food to minimize nausea. In addition to the prescribed pain medication, you may take over-the-counter pain relievers such as Tylenol.  Do NOT take additional tylenol if your pain medication already has tylenol in it.  Aspirin 325mg  daily for four weeks.      FOLLOWUP INSTRUCTIONS: 1. Follow up at the Physical Therapy Clinic 3-4 days following surgery. This appointment should be scheduled unless other arrangements have been made.The Physical Therapy scheduling number is [phone] if an appointment has not already been arranged.  2. Contact Dr. Eddie Dibbles office during office hours at [phone] or the practice after hours line at [phone] for non-emergencies. For medical emergencies call 911.   Discharge Location: Home

## 2021-04-20 NOTE — Anesthesia Preprocedure Evaluation (Addendum)
Anesthesia Evaluation  Patient identified by MRN, date of birth, ID band Patient awake    Reviewed: Allergy & Precautions, NPO status , Patient's Chart, lab work & pertinent test results, reviewed documented beta blocker date and time   Airway Mallampati: II  TM Distance: >3 FB Neck ROM: Full    Dental  (+) Dental Advisory Given, Missing   Pulmonary sleep apnea , Current Smoker and Patient abstained from smoking.,    Pulmonary exam normal breath sounds clear to auscultation       Cardiovascular hypertension, Pt. on home beta blockers (-) angina(-) Past MI Normal cardiovascular exam Rhythm:Regular Rate:Normal     Neuro/Psych PSYCHIATRIC DISORDERS Anxiety TIA   GI/Hepatic negative GI ROS, Neg liver ROS,   Endo/Other  Obesity   Renal/GU negative Renal ROS     Musculoskeletal right partial biceps tendon repair   Abdominal   Peds  Hematology negative hematology ROS (+)   Anesthesia Other Findings Day of surgery medications reviewed with the patient.  Reproductive/Obstetrics                            Anesthesia Physical Anesthesia Plan  ASA: 2  Anesthesia Plan: MAC and Regional   Post-op Pain Management: Regional block   Induction: Intravenous  PONV Risk Score and Plan: 0 and Midazolam, Dexamethasone and Ondansetron  Airway Management Planned: Natural Airway and Nasal Cannula  Additional Equipment:   Intra-op Plan:   Post-operative Plan:   Informed Consent: I have reviewed the patients History and Physical, chart, labs and discussed the procedure including the risks, benefits and alternatives for the proposed anesthesia with the patient or authorized representative who has indicated his/her understanding and acceptance.     Dental advisory given  Plan Discussed with: CRNA  Anesthesia Plan Comments:         Anesthesia Quick Evaluation

## 2021-04-20 NOTE — Transfer of Care (Signed)
Immediate Anesthesia Transfer of Care Note  Patient: Tyrek Lawhorn  Procedure(s) Performed: RIGHT DISTAL BICEPS REPAIR (Right: Arm Upper)  Patient Location: PACU  Anesthesia Type:MAC and Regional  Level of Consciousness: drowsy and responds to stimulation  Airway & Oxygen Therapy: Patient Spontanous Breathing and Patient connected to face mask oxygen  Post-op Assessment: Report given to RN and Post -op Vital signs reviewed and stable  Post vital signs: Reviewed and stable  Last Vitals:  Vitals Value Taken Time  BP 112/89 04/20/21 1727  Temp    Pulse 57 04/20/21 1729  Resp 12 04/20/21 1729  SpO2 97 % 04/20/21 1729  Vitals shown include unvalidated device data.  Last Pain:  Vitals:   04/20/21 1311  TempSrc: Oral         Complications: No notable events documented.

## 2021-04-21 ENCOUNTER — Other Ambulatory Visit (HOSPITAL_BASED_OUTPATIENT_CLINIC_OR_DEPARTMENT_OTHER): Payer: Self-pay | Admitting: Orthopaedic Surgery

## 2021-04-21 MED ORDER — IBUPROFEN 800 MG PO TABS: 800.0000 mg | ORAL_TABLET | Freq: Three times a day (TID) | ORAL | 0 refills | Status: AC

## 2021-04-21 MED ORDER — ACETAMINOPHEN 500 MG PO TABS: 500.0000 mg | ORAL_TABLET | Freq: Three times a day (TID) | ORAL | 0 refills | Status: AC

## 2021-04-21 MED ORDER — ASPIRIN EC 325 MG PO TBEC: 325.0000 mg | DELAYED_RELEASE_TABLET | Freq: Every day | ORAL | 0 refills | Status: AC

## 2021-04-21 MED ORDER — HYDROMORPHONE HCL 2 MG PO TABS: 2.0000 mg | ORAL_TABLET | Freq: Two times a day (BID) | ORAL | 0 refills | Status: AC | PRN

## 2021-04-21 NOTE — Anesthesia Postprocedure Evaluation (Signed)
Anesthesia Post Note  Patient: Ryan Middleton  Procedure(s) Performed: RIGHT DISTAL BICEPS REPAIR (Right: Arm Upper)     Patient location during evaluation: PACU Anesthesia Type: Regional and MAC Level of consciousness: awake and alert Pain management: pain level controlled Vital Signs Assessment: post-procedure vital signs reviewed and stable Respiratory status: spontaneous breathing, nonlabored ventilation, respiratory function stable and patient connected to nasal cannula oxygen Cardiovascular status: stable and blood pressure returned to baseline Postop Assessment: no apparent nausea or vomiting Anesthetic complications: no   No notable events documented.  Last Vitals:  Vitals:   04/20/21 1745 04/20/21 1758  BP: 119/79 132/81  Pulse: (!) 52 66  Resp: 10 16  Temp:  (!) 36.2 C  SpO2: 95% 95%    Last Pain:  Vitals:   04/20/21 1758  TempSrc:   PainSc: 0-No pain                 Tameron Lama S

## 2021-04-23 ENCOUNTER — Other Ambulatory Visit (HOSPITAL_BASED_OUTPATIENT_CLINIC_OR_DEPARTMENT_OTHER): Payer: Self-pay

## 2021-04-23 ENCOUNTER — Encounter (HOSPITAL_COMMUNITY): Payer: Self-pay | Admitting: Orthopaedic Surgery

## 2021-05-01 ENCOUNTER — Other Ambulatory Visit: Payer: Self-pay

## 2021-05-01 ENCOUNTER — Ambulatory Visit (INDEPENDENT_AMBULATORY_CARE_PROVIDER_SITE_OTHER): Payer: BC Managed Care – PPO | Admitting: Orthopaedic Surgery

## 2021-05-01 DIAGNOSIS — S46219A Strain of muscle, fascia and tendon of other parts of biceps, unspecified arm, initial encounter: Secondary | ICD-10-CM | POA: Diagnosis not present

## 2021-05-01 NOTE — Progress Notes (Signed)
Post Operative Evaluation    Procedure/Date of Surgery: Status post right distal biceps repair on 04/20/2021  Interval History:    Status post right distal biceps repair 2 weeks prior.  Overall he is doing very well.  He does have some soreness in the biceps.  He has been in a splint for 2 weeks and in a sling.  He needed oxycodone for a brief time.  He is taking his aspirin.   PMH/PSH/Family History/Social History/Meds/Allergies:    Past Medical History:  Diagnosis Date   Hypertension    Post traumatic stress disorder    Sleep apnea    Stroke [facility])    TIA 2021   Past Surgical History:  Procedure Laterality Date   APPENDECTOMY     DISTAL BICEPS TENDON REPAIR Right 04/20/2021   Procedure: RIGHT DISTAL BICEPS REPAIR;  Surgeon: [name], MD;  Location: [place];  Service: Orthopedics;  Laterality: Right;   FOOT SURGERY Bilateral    plantar fascitis   Social History   Socioeconomic History   Marital status: Married    Spouse name: Not on file   Number of children: Not on file   Years of education: Not on file   Highest education level: Not on file  Occupational History   Not on file  Tobacco Use   Smoking status: Every Day    Packs/day: 0.50    Years: 30.00    Pack years: 15.00    Types: Cigarettes   Smokeless tobacco: Never  Vaping Use   Vaping Use: Never used  Substance and Sexual Activity   Alcohol use: Not Currently    Comment: occ   Drug use: No   Sexual activity: Not on file  Other Topics Concern   Not on file  Social History Narrative   Not on file   Social Determinants of Health   Financial Resource Strain: Not on file  Food Insecurity: Not on file  Transportation Needs: Not on file  Physical Activity: Not on file  Stress: Not on file  Social Connections: Not on file   No family history on file. Allergies  Allergen Reactions   Hydrocodone Nausea And Vomiting   Oxycodone Nausea Only   Simvastatin Rash    Current Outpatient Medications  Medication Sig Dispense Refill   acetaminophen (TYLENOL) 500 MG tablet Take 1 tablet (500 mg total) by mouth every 8 (eight) hours for 10 days. 30 tablet 0   aspirin EC 325 MG tablet Take 1 tablet (325 mg total) by mouth daily. 30 tablet 0   atorvastatin (LIPITOR) 40 MG tablet Take 1 tablet (40 mg total) by mouth daily. 90 tablet 3   Clobetasol Prop Emollient Base 0.05 % emollient cream Apply 1 application topically at bedtime. (Patient not taking: Reported on 04/17/2021) 15 g 5   ibuprofen (ADVIL) 800 MG tablet Take 1 tablet (800 mg total) by mouth every 8 (eight) hours for 10 days. Please take with food, please alternate with acetaminophen 30 tablet 0   propranolol ER (INDERAL LA) 80 MG 24 hr capsule Take 1 capsule (80 mg total) by mouth daily. 90 capsule 3   QUEtiapine (SEROQUEL) 100 MG tablet Take 100 mg by mouth at bedtime.     Rimegepant Sulfate (NURTEC) 75 MG TBDP Take 75 mg by mouth as needed (For acute  migraine relief). (Patient not taking: Reported on 04/17/2021) 4 tablet 0   No current facility-administered medications for this visit.   No results found.  Review of Systems:   A ROS was performed including pertinent positives and negatives as documented in the HPI.   Musculoskeletal Exam:    There were no vitals taken for this visit.  Incision is well-healing and well-appearing.  He is able to fire EPL as well as wrist extensors.  He does have a small thermal injury on the dorsal aspect of the right arm.  Imaging:    None  I personally reviewed and interpreted the radiographs.   Assessment:   55 year old male 2-week status post right distal biceps repair overall doing very well.  I will see him back in 2 weeks.  He will begin physical therapy.  He may progress to full extension over the course of the next 2 weeks.  Plan :    -Return to clinic in 2 weeks     I personally saw and evaluated the patient, and participated in the  management and treatment plan.  [name], MD Attending Physician, Orthopedic Surgery  This document was dictated using Dragon voice recognition software. A reasonable attempt at proof reading has been made to minimize errors.

## 2021-05-02 NOTE — Therapy (Signed)
OUTPATIENT PHYSICAL THERAPY SHOULDER EVALUATION   Patient Name: Ryan Middleton MRN: [id number] DOB:Jul 08, 1965, 55 y.o., male Today's Date: 05/03/2021   PT End of Session - 05/03/21 1352     Visit Number 1    Number of Visits 22    Date for PT Re-Evaluation 08/01/21    Authorization Type BCBS    PT Start Time 1345    PT Stop Time 1430    PT Time Calculation (min) 45 min    Activity Tolerance Patient tolerated treatment well    Behavior During Therapy [facility] for tasks assessed/performed             Past Medical History:  Diagnosis Date   Hypertension    Post traumatic stress disorder    Sleep apnea    Stroke [facility])    TIA 2021   Past Surgical History:  Procedure Laterality Date   APPENDECTOMY     DISTAL BICEPS TENDON REPAIR Right 04/20/2021   Procedure: RIGHT DISTAL BICEPS REPAIR;  Surgeon: [name], MD;  Location: [place];  Service: Orthopedics;  Laterality: Right;   FOOT SURGERY Bilateral    plantar fascitis   Patient Active Problem List   Diagnosis Date Noted   Biceps tendon tear    Pain in right shoulder 03/01/2021   Pain in right elbow 03/01/2021   TIA (transient ischemic attack) 07/25/2020    PCP: [facility]  REFERRING PROVIDER: [name], MD  REFERRING DIAG: [phone] (ICD-10-CM) - Biceps tendon tear   THERAPY DIAG:  Pain in right elbow  Stiffness of right elbow, not elsewhere classified  Muscle weakness (generalized)   ONSET DATE: 04/20/2021   SUBJECTIVE:                                                                                                                                                                                      SUBJECTIVE STATEMENT: Pt states that the R elbow pain going on for approximately 3 months prior to surgery. The original injury happened he was putting 2 pipes together and immediately something pop, immediately had pain. He felt that it hyperextended.   Since surgery, pt states the pain is  good. He has had no pain. He denies signs of infection. He has been wearing the elbow brace and has not tried lifting with that arm. He only takes the brace off to shower. Uses a shower bag on the arm.   *Pt reports that the R and L shoulders have a history of subluxation that occurs frequently.*  PERTINENT HISTORY: TIA, bilateral shoulder subluxation history*   PAIN:  Are you having pain? No VAS scale: 0/10   PRECAUTIONS: Other: R  elbow  WEIGHT BEARING RESTRICTIONS Yes NWB for first 2 weeks  He may progress to full extension over the course of the next 2 weeks (per last MD note)  FALLS:  Has patient fallen in last 6 months? No Number of falls: 0  LIVING ENVIRONMENT: Lives with: lives with their family Lives in: House/apartment  OCCUPATION: Works in maintenance  PLOF: [place] : Pt states he wants to be able to get back to work.   OBJECTIVE:   DIAGNOSTIC FINDINGS:  IMPRESSION: 1. Significant distal biceps tendinopathy with interstitial tears and surrounding inflammation/fluid. No complete tear/rupture. 2. 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 3. Intact medial and lateral collateral ligaments. 4. No significant degenerative changes, joint effusion or loose bodies.  IMPRESSION: 1. No acute fracture or dislocation of the right elbow or right shoulder. 2. 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  PATIENT SURVEYS:  FOTO 37 D/C 60 MCII 9pts  COGNITION:  Overall cognitive status: Within functional limits for tasks assessed     SENSATION:  Light touch: Appears intact   POSTURE: WFL  PALPATION: No TTP noted Incision appear clean with steri strips in place, no erythema or signs of acute inflammation  UPPER EXTREMITY AROM/PROM:  L shoulder: WFL  R shoulder: able to reach BHB and behind back   No elbow AROM  R PROM flexion 130, soft end feel; boggy/full feeling per pt R PROM ext -20  deg  UPPER EXTREMITY MMT: Untested due to surgical precautions   TODAY'S TREATMENT:    Exercises Shoulder Rolls in Sitting - 5-6 x daily - 7 x weekly - 1 sets - 20 reps Seated Gripping Towel - 5-6 x daily - 7 x weekly - 1 sets - 20 reps Wrist Extension AROM - 5-6 x daily - 7 x weekly - 1 sets - 20 reps Wrist Flexion AROM - 5-6 x daily - 7 x weekly - 1 sets - 20 reps  Attempted self PROM but pt unable to perform without AAROM   PATIENT EDUCATION: Education details: precautions, edema management, diagnosis, prognosis, anatomy, exercise progression, DOMS expectations, muscle firing,  envelope of function, HEP, POC  Person educated: Patient Education method: Explanation, Demonstration, Tactile cues, Verbal cues, and Handouts Education comprehension: verbalized understanding, returned demonstration, verbal cues required, and tactile cues required   HOME EXERCISE PROGRAM: Access Code: [email or web address] URL: [email or web address] Date: 05/03/2021 Prepared by: Daleen Bo  ASSESSMENT:  CLINICAL IMPRESSION: Patient is a 55 y.o. male who was seen today for physical therapy evaluation and treatment for s/p R distal biceps tendon repair. Pt's pain appears to be very well controlled at this time. Pt's presentation consistent with expectation following surgery. Plan to progress PROM of elbow extension to full ext ROM over next 2 weeks, per MD. Objective impairments include decreased ROM, decreased strength, hypomobility, increased edema, increased fascial restrictions, increased muscle spasms, impaired flexibility, impaired UE functional use, improper body mechanics, postural dysfunction, and pain. These impairments are limiting patient from cleaning, community activity, driving, meal prep, occupation, laundry, yard work, shopping, and exercise and personal care . Personal factors including Age, Fitness, and Profession are also affecting patient's functional outcome. Patient will benefit from  skilled PT to address above impairments and improve overall function.  REHAB POTENTIAL: Good  CLINICAL DECISION MAKING: Stable/uncomplicated  EVALUATION COMPLEXITY: Low   GOALS:  SHORT TERM GOALS:  STG Name Target Date Goal status  1 Pt will become independent with HEP in order to demonstrate synthesis of  PT education.  06/14/2021 INITIAL  2 Pt will be able to demonstrate full elbow PROM in order to demonstrate functional improvement in UE function for self-care and house hold duties.  06/14/2021 INITIAL  3 Pt will be able to demonstrate full elbow AROM in order to demonstrate functional improvement in UE function for self-care and house hold duties.  06/14/2021 INITIAL  4 Pt will score at least 9 pt increase on FOTO to demonstrate functional improvement in MCII and pt perceived function.   06/14/2021 INITIAL   LONG TERM GOALS:   LTG Name Target Date Goal status  1 Pt  will become independent with final HEP in order to demonstrate synthesis of PT education.  07/26/2021 INITIAL  2 Pt will be able to carry/hold >10 lbs in order to demonstrate functional improvement in R UE strength for return to PLOF and exercise.  07/26/2021 INITIAL  3 Pt will score >/= 60 on FOTO to demonstrate improvement in perceived R elbow function.  07/26/2021 INITIAL  4 Pt will be able to demonstrate ability to simulate work related tasks without limitation in order to demonstrate functional improvement in UE/LE function for self-care and house hold duties.  07/26/2021 INITIAL   PLAN: PT FREQUENCY: 1-2x/week  PT DURATION: 12 weeks   PLANNED INTERVENTIONS: Therapeutic exercises, Therapeutic activity, Neuro Muscular re-education, Patient/Family education, Joint mobilization, Aquatic Therapy, Dry Needling, Electrical stimulation, Spinal mobilization, Cryotherapy, Moist heat, Compression bandaging, scar mobilization, Taping, Vasopneumatic device, Traction, Ultrasound, and Manual therapy  PLAN FOR NEXT SESSION: review  HEP, PROM elbow, wrist/grip strength  Daleen Bo PT, DPT 05/03/21 2:41 PM

## 2021-05-03 ENCOUNTER — Ambulatory Visit (HOSPITAL_BASED_OUTPATIENT_CLINIC_OR_DEPARTMENT_OTHER): Payer: BC Managed Care – PPO | Attending: Orthopaedic Surgery | Admitting: Physical Therapy

## 2021-05-03 ENCOUNTER — Encounter (HOSPITAL_BASED_OUTPATIENT_CLINIC_OR_DEPARTMENT_OTHER): Payer: Self-pay | Admitting: Physical Therapy

## 2021-05-03 ENCOUNTER — Other Ambulatory Visit: Payer: Self-pay

## 2021-05-03 DIAGNOSIS — M25621 Stiffness of right elbow, not elsewhere classified: Secondary | ICD-10-CM | POA: Insufficient documentation

## 2021-05-03 DIAGNOSIS — S46219A Strain of muscle, fascia and tendon of other parts of biceps, unspecified arm, initial encounter: Secondary | ICD-10-CM | POA: Insufficient documentation

## 2021-05-03 DIAGNOSIS — M25521 Pain in right elbow: Secondary | ICD-10-CM | POA: Diagnosis not present

## 2021-05-03 DIAGNOSIS — M6281 Muscle weakness (generalized): Secondary | ICD-10-CM | POA: Diagnosis not present

## 2021-05-08 ENCOUNTER — Other Ambulatory Visit (HOSPITAL_BASED_OUTPATIENT_CLINIC_OR_DEPARTMENT_OTHER): Payer: Self-pay | Admitting: Orthopaedic Surgery

## 2021-05-08 ENCOUNTER — Telehealth: Payer: Self-pay

## 2021-05-08 MED ORDER — CEPHALEXIN 500 MG PO CAPS: 500.0000 mg | ORAL_CAPSULE | Freq: Four times a day (QID) | ORAL | 0 refills | Status: AC

## 2021-05-08 NOTE — Telephone Encounter (Signed)
Patient called states he has a spot on his forearm (+redness no drainage) since after taking the splint off. Has been putting Abx cream.  No fevers no chills. Would like Abx sent to his pharm. Would like a CB to discuss.    [address] [place] in [place].   CB [phone]

## 2021-05-08 NOTE — Telephone Encounter (Signed)
Called patient back and informed him antibiotics were ordered to [facility]

## 2021-05-09 ENCOUNTER — Ambulatory Visit (HOSPITAL_BASED_OUTPATIENT_CLINIC_OR_DEPARTMENT_OTHER): Payer: BC Managed Care – PPO | Attending: Orthopaedic Surgery | Admitting: Physical Therapy

## 2021-05-09 ENCOUNTER — Other Ambulatory Visit: Payer: Self-pay

## 2021-05-09 ENCOUNTER — Encounter (HOSPITAL_BASED_OUTPATIENT_CLINIC_OR_DEPARTMENT_OTHER): Payer: Self-pay | Admitting: Orthopaedic Surgery

## 2021-05-09 ENCOUNTER — Encounter (HOSPITAL_BASED_OUTPATIENT_CLINIC_OR_DEPARTMENT_OTHER): Payer: Self-pay | Admitting: Physical Therapy

## 2021-05-09 DIAGNOSIS — M6281 Muscle weakness (generalized): Secondary | ICD-10-CM | POA: Diagnosis not present

## 2021-05-09 DIAGNOSIS — M25521 Pain in right elbow: Secondary | ICD-10-CM | POA: Diagnosis not present

## 2021-05-09 DIAGNOSIS — M25621 Stiffness of right elbow, not elsewhere classified: Secondary | ICD-10-CM | POA: Diagnosis not present

## 2021-05-09 NOTE — Therapy (Signed)
OUTPATIENT PHYSICAL THERAPY TREATMENT NOTE   Patient Name: Ryan Middleton MRN: [id number] DOB:01-Aug-1965, 56 y.o., male Today's Date: 05/10/2021  PCP: [place] [facility] - 05/10/21 2119     Visit Number 2    Number of Visits 22    Date for PT Re-Evaluation 08/01/21    Authorization Type BCBS    PT Start Time 1345    PT Stop Time 1428    PT Time Calculation (min) 43 min    Activity Tolerance Patient tolerated treatment well    Behavior During Therapy [facility] for tasks assessed/performed             Past Medical History:  Diagnosis Date   Hypertension    Post traumatic stress disorder    Sleep apnea    Stroke [facility])    TIA 2021   Past Surgical History:  Procedure Laterality Date   APPENDECTOMY     DISTAL BICEPS TENDON REPAIR Right 04/20/2021   Procedure: RIGHT DISTAL BICEPS REPAIR;  Surgeon: [name], MD;  Location: [place];  Service: Orthopedics;  Laterality: Right;   FOOT SURGERY Bilateral    plantar fascitis   Patient Active Problem List   Diagnosis Date Noted   Biceps tendon tear    Pain in right shoulder 03/01/2021   Pain in right elbow 03/01/2021   TIA (transient ischemic attack) 07/25/2020   PCP: [facility]   REFERRING PROVIDER: [name], MD   REFERRING DIAG: [phone] (ICD-10-CM) - Biceps tendon tear    THERAPY DIAG:  Pain in right elbow   Stiffness of right elbow, not elsewhere classified   Muscle weakness (generalized)     ONSET DATE: 04/20/2021     SUBJECTIVE:                                                                                                                                                                                       SUBJECTIVE STATEMENT: Patient reports minor soreness after the initial eval. He has been doing his exercises at home.    Since surgery, pt states the pain is good. He has had no pain. He denies signs of infection. He has been  wearing the elbow brace and has not tried lifting with that arm. He only takes the brace off to shower. Uses a shower bag on the arm.    *Pt reports that the R and L shoulders have a history of subluxation that occurs frequently.*   PERTINENT HISTORY: TIA, bilateral shoulder subluxation history*   PAIN:  Are you having pain? No VAS scale: 0/10     PRECAUTIONS:  Other: R elbow   WEIGHT BEARING RESTRICTIONS Yes NWB for first 2 weeks   He may progress to full extension over the course of the next 2 weeks (per last MD note)   FALLS:  Has patient fallen in last 6 months? No Number of falls: 0   LIVING ENVIRONMENT: Lives with: lives with their family Lives in: House/apartment   OCCUPATION: Works in maintenance   PLOF: [place] : Pt states he wants to be able to get back to work.    OBJECTIVE:    Extension: -40 ( very guarded with measurement. Better extension noted without measurement per visual inspection      PATIENT SURVEYS:  FOTO 37 D/C 60 MCII 9pts   TODAY'S TREATMENT:  1/6  Shoulder Rolls in Sitting - 5-6 x daily 20 reps  Wrist Extension AROM 20 reps Wrist Flexion AROM - 5-6 x daily  20 reps Putty: Key grip 2x10 yellow  Pincer grip x20  Grip x20   Manual gentle PROM into flexion and extension/ supination     Eval   Exercises Shoulder Rolls in Sitting - 5-6 x daily - 7 x weekly - 1 sets - 20 reps Seated Gripping Towel - 5-6 x daily - 7 x weekly - 1 sets - 20 reps Wrist Extension AROM - 5-6 x daily - 7 x weekly - 1 sets - 20 reps Wrist Flexion AROM - 5-6 x daily - 7 x weekly - 1 sets - 20 reps   Attempted self PROM but pt unable to perform without AAROM     PATIENT EDUCATION: Education details: precautions, edema management, diagnosis, prognosis, anatomy, exercise progression, DOMS expectations, muscle firing,  envelope of function, HEP, POC   Person educated: Patient Education method: Explanation, Demonstration, Tactile cues,  Verbal cues, and Handouts Education comprehension: verbalized understanding, returned demonstration, verbal cues required, and tactile cues required     HOME EXERCISE PROGRAM: Access Code: [email or web address] URL: [email or web address] Date: 05/03/2021 Prepared by: Daleen Bo   ASSESSMENT:   CLINICAL IMPRESSION: Patient was gaurded with extension stretching today. Her reported no significant pain, just tightness. He was advised this is expected. Therapy advised him of the importance of getting extension back. Therapy also gave him putty for home as well ass some different job specific grips to work on. While perfroming PROM it was noted that his wound on his forearm had leaked through the bandage and onto the pillow. The wound was observed and MD was contacted. He will see the MD tomorrow to evaluate the wound.    Objective impairments include decreased ROM, decreased strength, hypomobility, increased edema, increased fascial restrictions, increased muscle spasms, impaired flexibility, impaired UE functional use, improper body mechanics, postural dysfunction, and pain. These impairments are limiting patient from cleaning, community activity, driving, meal prep, occupation, laundry, yard work, shopping, and exercise and personal care . Personal factors including Age, Fitness, and Profession are also affecting patient's functional outcome. Patient will benefit from skilled PT to address above impairments and improve overall function.   REHAB POTENTIAL: Good   CLINICAL DECISION MAKING: Stable/uncomplicated   EVALUATION COMPLEXITY: Low     GOALS:   SHORT TERM GOALS:   STG Name Target Date Goal status  1 Pt will become independent with HEP in order to demonstrate synthesis of PT education.   06/14/2021 INITIAL  2 Pt will be able to demonstrate full elbow PROM in order to demonstrate functional improvement in UE function for self-care and house hold duties.  06/14/2021 INITIAL  3 Pt will  be able to demonstrate full elbow AROM in order to demonstrate functional improvement in UE function for self-care and house hold duties.   06/14/2021 INITIAL  4 Pt will score at least 9 pt increase on FOTO to demonstrate functional improvement in MCII and pt perceived function.    06/14/2021 INITIAL    LONG TERM GOALS:    LTG Name Target Date Goal status  1 Pt  will become independent with final HEP in order to demonstrate synthesis of PT education.   07/26/2021 INITIAL  2 Pt will be able to carry/hold >10 lbs in order to demonstrate functional improvement in R UE strength for return to PLOF and exercise.  07/26/2021 INITIAL  3 Pt will score >/= 60 on FOTO to demonstrate improvement in perceived R elbow function.   07/26/2021 INITIAL  4 Pt will be able to demonstrate ability to simulate work related tasks without limitation in order to demonstrate functional improvement in UE/LE function for self-care and house hold duties.   07/26/2021 INITIAL    PLAN: PT FREQUENCY: 1-2x/week   PT DURATION: 12 weeks    PLANNED INTERVENTIONS: Therapeutic exercises, Therapeutic activity, Neuro Muscular re-education, Patient/Family education, Joint mobilization, Aquatic Therapy, Dry Needling, Electrical stimulation, Spinal mobilization, Cryotherapy, Moist heat, Compression bandaging, scar mobilization, Taping, Vasopneumatic device, Traction, Ultrasound, and Manual therapy   PLAN FOR NEXT SESSION: review HEP, PROM elbow, wrist/grip strength   Carney Living PT DPT  05/10/2021, 9:29 PM

## 2021-05-10 ENCOUNTER — Encounter (HOSPITAL_BASED_OUTPATIENT_CLINIC_OR_DEPARTMENT_OTHER): Payer: Self-pay | Admitting: Physical Therapy

## 2021-05-10 ENCOUNTER — Ambulatory Visit (INDEPENDENT_AMBULATORY_CARE_PROVIDER_SITE_OTHER): Payer: BC Managed Care – PPO | Admitting: Orthopaedic Surgery

## 2021-05-10 DIAGNOSIS — S46219A Strain of muscle, fascia and tendon of other parts of biceps, unspecified arm, initial encounter: Secondary | ICD-10-CM

## 2021-05-10 NOTE — Progress Notes (Signed)
Post Operative Evaluation    Procedure/Date of Surgery: Status post right distal biceps repair on 04/20/2021  Interval History:   05/10/2021: Today as he is having concerns for wound drainage about the dorsal forearm.  Denies any fevers or frank drainage.  He was started on a 3-day course of Keflex.  Status post right distal biceps repair 2 weeks prior.  Overall he is doing very well.  He does have some soreness in the biceps.  He has been in a splint for 2 weeks and in a sling.  He needed oxycodone for a brief time.  He is taking his aspirin.   PMH/PSH/Family History/Social History/Meds/Allergies:    Past Medical History:  Diagnosis Date   Hypertension    Post traumatic stress disorder    Sleep apnea    Stroke [facility])    TIA 2021   Past Surgical History:  Procedure Laterality Date   APPENDECTOMY     DISTAL BICEPS TENDON REPAIR Right 04/20/2021   Procedure: RIGHT DISTAL BICEPS REPAIR;  Surgeon: [name], MD;  Location: [place];  Service: Orthopedics;  Laterality: Right;   FOOT SURGERY Bilateral    plantar fascitis   Social History   Socioeconomic History   Marital status: Married    Spouse name: Not on file   Number of children: Not on file   Years of education: Not on file   Highest education level: Not on file  Occupational History   Not on file  Tobacco Use   Smoking status: Every Day    Packs/day: 0.50    Years: 30.00    Pack years: 15.00    Types: Cigarettes   Smokeless tobacco: Never  Vaping Use   Vaping Use: Never used  Substance and Sexual Activity   Alcohol use: Not Currently    Comment: occ   Drug use: No   Sexual activity: Not on file  Other Topics Concern   Not on file  Social History Narrative   Not on file   Social Determinants of Health   Financial Resource Strain: Not on file  Food Insecurity: Not on file  Transportation Needs: Not on file  Physical Activity: Not on file  Stress: Not on file   Social Connections: Not on file   No family history on file. Allergies  Allergen Reactions   Hydrocodone Nausea And Vomiting   Oxycodone Nausea Only   Simvastatin Rash   Current Outpatient Medications  Medication Sig Dispense Refill   cephALEXin (KEFLEX) 500 MG capsule Take 1 capsule (500 mg total) by mouth 4 (four) times daily for 3 days. 12 capsule 0   aspirin EC 325 MG tablet Take 1 tablet (325 mg total) by mouth daily. 30 tablet 0   atorvastatin (LIPITOR) 40 MG tablet Take 1 tablet (40 mg total) by mouth daily. 90 tablet 3   Clobetasol Prop Emollient Base 0.05 % emollient cream Apply 1 application topically at bedtime. (Patient not taking: Reported on 04/17/2021) 15 g 5   propranolol ER (INDERAL LA) 80 MG 24 hr capsule Take 1 capsule (80 mg total) by mouth daily. 90 capsule 3   QUEtiapine (SEROQUEL) 100 MG tablet Take 100 mg by mouth at bedtime.     Rimegepant Sulfate (NURTEC) 75 MG TBDP Take 75 mg by mouth as needed (For acute migraine relief). (  Patient not taking: Reported on 04/17/2021) 4 tablet 0   No current facility-administered medications for this visit.   No results found.  Review of Systems:   A ROS was performed including pertinent positives and negatives as documented in the HPI.   Musculoskeletal Exam:    There were no vitals taken for this visit.  Incision is well-healing and well-appearing.  He is able to fire EPL as well as wrist extensors.  His dorsal forearm wound is unroofing superficially.  Imaging:    None  I personally reviewed and interpreted the radiographs.   Assessment:   56 year old male 2-week status post right distal biceps repair overall doing very well from a distal biceps perspective.  I would like to see him in 2 weeks for an additional wound check.  He will continue physical therapy for the right elbow  Plan :    -Return to clinic in 2 weeks     I personally saw and evaluated the patient, and participated in the management and  treatment plan.  [name], MD Attending Physician, Orthopedic Surgery  This document was dictated using Dragon voice recognition software. A reasonable attempt at proof reading has been made to minimize errors.

## 2021-05-11 ENCOUNTER — Encounter (HOSPITAL_BASED_OUTPATIENT_CLINIC_OR_DEPARTMENT_OTHER): Payer: Self-pay | Admitting: Physical Therapy

## 2021-05-14 ENCOUNTER — Encounter (HOSPITAL_BASED_OUTPATIENT_CLINIC_OR_DEPARTMENT_OTHER): Payer: Self-pay | Admitting: Orthopaedic Surgery

## 2021-05-15 ENCOUNTER — Institutional Professional Consult (permissible substitution): Payer: No Typology Code available for payment source | Admitting: Neurology

## 2021-05-15 ENCOUNTER — Encounter: Payer: Self-pay | Admitting: Neurology

## 2021-05-16 ENCOUNTER — Other Ambulatory Visit: Payer: Self-pay

## 2021-05-16 ENCOUNTER — Ambulatory Visit (HOSPITAL_BASED_OUTPATIENT_CLINIC_OR_DEPARTMENT_OTHER): Payer: BC Managed Care – PPO | Admitting: Physical Therapy

## 2021-05-16 ENCOUNTER — Encounter (HOSPITAL_BASED_OUTPATIENT_CLINIC_OR_DEPARTMENT_OTHER): Payer: Self-pay | Admitting: Physical Therapy

## 2021-05-16 DIAGNOSIS — M25621 Stiffness of right elbow, not elsewhere classified: Secondary | ICD-10-CM | POA: Diagnosis not present

## 2021-05-16 DIAGNOSIS — M6281 Muscle weakness (generalized): Secondary | ICD-10-CM | POA: Diagnosis not present

## 2021-05-16 DIAGNOSIS — M25521 Pain in right elbow: Secondary | ICD-10-CM

## 2021-05-16 NOTE — Therapy (Signed)
OUTPATIENT PHYSICAL THERAPY TREATMENT NOTE   Patient Name: Ryan Middleton MRN: [id number] DOB:1966-04-25, 56 y.o., male Today's Date: 05/16/2021  PCP: [place] [facility] - 05/16/21 1307     Visit Number 3    Number of Visits 22    Date for PT Re-Evaluation 08/01/21    Authorization Type BCBS    PT Start Time 1307   pt arrives late   PT Stop Time 1345    PT Time Calculation (min) 38 min    Activity Tolerance Patient tolerated treatment well    Behavior During Therapy [facility] for tasks assessed/performed              Past Medical History:  Diagnosis Date   Hypertension    Post traumatic stress disorder    Sleep apnea    Stroke [facility])    TIA 2021   Past Surgical History:  Procedure Laterality Date   APPENDECTOMY     DISTAL BICEPS TENDON REPAIR Right 04/20/2021   Procedure: RIGHT DISTAL BICEPS REPAIR;  Surgeon: [name], MD;  Location: [place];  Service: Orthopedics;  Laterality: Right;   FOOT SURGERY Bilateral    plantar fascitis   Patient Active Problem List   Diagnosis Date Noted   Biceps tendon tear    Pain in right shoulder 03/01/2021   Pain in right elbow 03/01/2021   TIA (transient ischemic attack) 07/25/2020   [facility]P: [facility]   REFERRING PROVIDER: [name], MD   REFERRING DIAG: [phone] (ICD-10-CM) - Biceps tendon tear    THERAPY DIAG:  Pain in right elbow   Stiffness of right elbow, not elsewhere classified   Muscle weakness (generalized)     ONSET DATE: 04/20/2021     SUBJECTIVE:                                                                                                                                                                                       SUBJECTIVE STATEMENT: Pt states the wound is looking much better. He has been using the antibiotic ointment and is done with ointment.   He states he has not been sleeping with the brace anymore. He is still  in a recliner with a modified pillow.    *Pt reports that the R and L shoulders have a history of subluxation that occurs frequently.*   PERTINENT HISTORY: TIA, bilateral shoulder subluxation history*   PAIN:  Are you having pain? No VAS scale: 0/10     PRECAUTIONS: Other: R elbow   WEIGHT BEARING RESTRICTIONS Yes NWB for first 2 weeks   He may progress  to full extension over the course of the next 2 weeks (per last MD note)   FALLS:  Has patient fallen in last 6 months? No Number of falls: 0   LIVING ENVIRONMENT: Lives with: lives with their family Lives in: House/apartment   OCCUPATION: Works in maintenance   PLOF: [place] : Pt states he wants to be able to get back to work.    OBJECTIVE:    Extension: -40 ( very guarded with measurement. Better extension noted without measurement per visual inspection      PATIENT SURVEYS:  FOTO 37 D/C 60 MCII 9pts   TODAY'S TREATMENT:   1/11  shoulder flexion & ABD in brace 2x10 Shoulder isometrics at door 3s 10x- flexion, ABD, IR, ER, ext Wrist Extension AROM 20 reps  Manual gentle PROM into flexion and extension, pronation/supination to tolerance- discomfort with supination at 90    1/6  Shoulder Rolls in Sitting - 5-6 x daily 20 reps  Wrist Extension AROM 20 reps Wrist Flexion AROM - 5-6 x daily  20 reps Putty: Key grip 2x10 yellow  Pincer grip x20  Grip x20   Manual gentle PROM into flexion and extension/ supination     Eval   Exercises Shoulder Rolls in Sitting - 5-6 x daily - 7 x weekly - 1 sets - 20 reps Seated Gripping Towel - 5-6 x daily - 7 x weekly - 1 sets - 20 reps Wrist Extension AROM - 5-6 x daily - 7 x weekly - 1 sets - 20 reps Wrist Flexion AROM - 5-6 x daily - 7 x weekly - 1 sets - 20 reps   Attempted self PROM but pt unable to perform without AAROM     PATIENT EDUCATION: Education details: precautions, edema management, precautions, anatomy, exercise progression,  DOMS expectations, muscle firing,  envelope of function, HEP, POC   Person educated: Patient Education method: Explanation, Demonstration, Tactile cues, Verbal cues, and Handouts Education comprehension: verbalized understanding, returned demonstration, verbal cues required, and tactile cues required     HOME EXERCISE PROGRAM: Access Code: [email or web address] URL: [email or web address] Date: 05/03/2021 Prepared by: Daleen Bo   ASSESSMENT:   CLINICAL IMPRESSION: Pt with good tolerance to PROM with stiffness as expected with extension. However, pt did have discomfort with PROM supination/pronation- motion only done to gentle sensation of tension. Pt's wound appears much better healed and dry at this time. Pt roughly -20 deg short of full extension. Pt with good tolerance to shoulder isometrics at this time without pain. Plan to add shoulder AROM/isometrics to HEP at next session if positive response after today.    Objective impairments include decreased ROM, decreased strength, hypomobility, increased edema, increased fascial restrictions, increased muscle spasms, impaired flexibility, impaired UE functional use, improper body mechanics, postural dysfunction, and pain. These impairments are limiting patient from cleaning, community activity, driving, meal prep, occupation, laundry, yard work, shopping, and exercise and personal care . Personal factors including Age, Fitness, and Profession are also affecting patient's functional outcome. Patient will benefit from skilled PT to address above impairments and improve overall function.   REHAB POTENTIAL: Good   CLINICAL DECISION MAKING: Stable/uncomplicated   EVALUATION COMPLEXITY: Low     GOALS:   SHORT TERM GOALS:   STG Name Target Date Goal status  1 Pt will become independent with HEP in order to demonstrate synthesis of PT education.   06/14/2021 INITIAL  2 Pt will be able to demonstrate full elbow PROM in order to demonstrate  functional improvement in UE function for self-care and house hold duties.   06/14/2021 INITIAL  3 Pt will be able to demonstrate full elbow AROM in order to demonstrate functional improvement in UE function for self-care and house hold duties.   06/14/2021 INITIAL  4 Pt will score at least 9 pt increase on FOTO to demonstrate functional improvement in MCII and pt perceived function.    06/14/2021 INITIAL    LONG TERM GOALS:    LTG Name Target Date Goal status  1 Pt  will become independent with final HEP in order to demonstrate synthesis of PT education.   07/26/2021 INITIAL  2 Pt will be able to carry/hold >10 lbs in order to demonstrate functional improvement in R UE strength for return to PLOF and exercise.  07/26/2021 INITIAL  3 Pt will score >/= 60 on FOTO to demonstrate improvement in perceived R elbow function.   07/26/2021 INITIAL  4 Pt will be able to demonstrate ability to simulate work related tasks without limitation in order to demonstrate functional improvement in UE/LE function for self-care and house hold duties.   07/26/2021 INITIAL    PLAN: PT FREQUENCY: 1-2x/week   PT DURATION: 12 weeks    PLANNED INTERVENTIONS: Therapeutic exercises, Therapeutic activity, Neuro Muscular re-education, Patient/Family education, Joint mobilization, Aquatic Therapy, Dry Needling, Electrical stimulation, Spinal mobilization, Cryotherapy, Moist heat, Compression bandaging, scar mobilization, Taping, Vasopneumatic device, Traction, Ultrasound, and Manual therapy   PLAN FOR NEXT SESSION: review HEP, PROM elbow, update HEP  Daleen Bo PT DPT  05/16/2021, 1:48 PM

## 2021-05-17 ENCOUNTER — Encounter (HOSPITAL_BASED_OUTPATIENT_CLINIC_OR_DEPARTMENT_OTHER): Payer: BC Managed Care – PPO | Admitting: Orthopaedic Surgery

## 2021-05-23 ENCOUNTER — Encounter (HOSPITAL_BASED_OUTPATIENT_CLINIC_OR_DEPARTMENT_OTHER): Payer: Self-pay | Admitting: Physical Therapy

## 2021-05-23 ENCOUNTER — Other Ambulatory Visit: Payer: Self-pay

## 2021-05-23 ENCOUNTER — Ambulatory Visit (HOSPITAL_BASED_OUTPATIENT_CLINIC_OR_DEPARTMENT_OTHER): Payer: BC Managed Care – PPO | Admitting: Physical Therapy

## 2021-05-23 DIAGNOSIS — M25621 Stiffness of right elbow, not elsewhere classified: Secondary | ICD-10-CM

## 2021-05-23 DIAGNOSIS — M6281 Muscle weakness (generalized): Secondary | ICD-10-CM

## 2021-05-23 DIAGNOSIS — M25521 Pain in right elbow: Secondary | ICD-10-CM

## 2021-05-23 NOTE — Therapy (Signed)
OUTPATIENT PHYSICAL THERAPY TREATMENT NOTE   Patient Name: Ryan Middleton MRN: [id number] DOB:04/24/1966, 56 y.o., male Today's Date: 05/23/2021  PCP: [place] [facility] - 05/23/21 1302     Visit Number 4    Number of Visits 22    Date for PT Re-Evaluation 08/01/21    PT Start Time 4098    PT Stop Time 1342    PT Time Calculation (min) 39 min    Activity Tolerance Patient tolerated treatment well    Behavior During Therapy [facility] for tasks assessed/performed              Past Medical History:  Diagnosis Date   Hypertension    Post traumatic stress disorder    Sleep apnea    Stroke [facility])    TIA 2021   Past Surgical History:  Procedure Laterality Date   APPENDECTOMY     DISTAL BICEPS TENDON REPAIR Right 04/20/2021   Procedure: RIGHT DISTAL BICEPS REPAIR;  Surgeon: [name], MD;  Location: [place];  Service: Orthopedics;  Laterality: Right;   FOOT SURGERY Bilateral    plantar fascitis   Patient Active Problem List   Diagnosis Date Noted   Biceps tendon tear    Pain in right shoulder 03/01/2021   Pain in right elbow 03/01/2021   TIA (transient ischemic attack) 07/25/2020   PCP: [facility]   REFERRING PROVIDER: [name], MD   REFERRING DIAG: [phone] (ICD-10-CM) - Biceps tendon tear    THERAPY DIAG:  Pain in right elbow   Stiffness of right elbow, not elsewhere classified   Muscle weakness (generalized)     ONSET DATE: 04/20/2021     SUBJECTIVE:                                                                                                                                                                                       SUBJECTIVE STATEMENT: Patient reports it has been feeling really good. He has minor pain with end range pronation. He otherwise is doing well. He has no pain at this time. He has been working on his exercises.    PERTINENT HISTORY: TIA, bilateral  shoulder subluxation history*   PAIN:  Are you having pain? No VAS scale: 0/10     PRECAUTIONS: Other: R elbow   WEIGHT BEARING RESTRICTIONS Yes NWB for first 2 weeks   He may progress to full extension over the course of the next 2 weeks (per last MD note)   FALLS:  Has patient fallen in last 6 months? No Number of falls: 0   LIVING ENVIRONMENT: Lives  with: lives with their family Lives in: House/apartment   OCCUPATION: Works in maintenance   PLOF: [place] : Pt states he wants to be able to get back to work.    OBJECTIVE:    Full    PATIENT SURVEYS:  FOTO 37 D/C 60 MCII 9pts   TODAY'S TREATMENT:  1/18  shoulder flexion & ABD in brace 2x10 Shoulder isometrics at door 3s 10x- flexion, ABD, IR, ER, ext Wrist Extension AROM 20 reps/ flexion x20 each 2 lb weight  AROM flexion /extension 3x10   Active pronation/ supination x20  Manual gentle PROM into flexion and extension, pronation/supination to tolerance-  minor discomfort with end range pronation     1/11  shoulder flexion & ABD in brace 2x10 Shoulder isometrics at door 3s 10x- flexion, ABD, IR, ER, ext Wrist Extension AROM 20 reps  Manual gentle PROM into flexion and extension, pronation/supination to tolerance- discomfort with supination at 90    1/6  Shoulder Rolls in Sitting - 5-6 x daily 20 reps  Wrist Extension AROM 20 reps Wrist Flexion AROM - 5-6 x daily  20 reps Putty: Key grip 2x10 yellow  Pincer grip x20  Grip x20   Manual gentle PROM into flexion and extension/ supination     Eval   Exercises Shoulder Rolls in Sitting - 5-6 x daily - 7 x weekly - 1 sets - 20 reps Seated Gripping Towel - 5-6 x daily - 7 x weekly - 1 sets - 20 reps Wrist Extension AROM - 5-6 x daily - 7 x weekly - 1 sets - 20 reps Wrist Flexion AROM - 5-6 x daily - 7 x weekly - 1 sets - 20 reps   Attempted self PROM but pt unable to perform without AAROM     PATIENT EDUCATION: Education  details: precautions, edema management, precautions, anatomy, exercise progression, DOMS expectations, muscle firing,  envelope of function, HEP, POC   Person educated: Patient Education method: Explanation, Demonstration, Tactile cues, Verbal cues, and Handouts Education comprehension: verbalized understanding, returned demonstration, verbal cues required, and tactile cues required     HOME EXERCISE PROGRAM: Access Code: [email or web address] URL: [email or web address] Date: 05/03/2021 Prepared by: Daleen Bo   ASSESSMENT:   CLINICAL IMPRESSION: Improved ability to pronate today although some tightness at end range. His elbow extension was nearly full after manual therapy. Per protocol therapy added in active elbow flexion and extension. He had no pain. He continues to tolerate his shoulder exercises well. Therapy will continue to progress as tolerated.   Objective impairments include decreased ROM, decreased strength, hypomobility, increased edema, increased fascial restrictions, increased muscle spasms, impaired flexibility, impaired UE functional use, improper body mechanics, postural dysfunction, and pain. These impairments are limiting patient from cleaning, community activity, driving, meal prep, occupation, laundry, yard work, shopping, and exercise and personal care . Personal factors including Age, Fitness, and Profession are also affecting patient's functional outcome. Patient will benefit from skilled PT to address above impairments and improve overall function.   REHAB POTENTIAL: Good   CLINICAL DECISION MAKING: Stable/uncomplicated   EVALUATION COMPLEXITY: Low     GOALS:   SHORT TERM GOALS:   STG Name Target Date Goal status 1/19  1 Pt will become independent with HEP in order to demonstrate synthesis of PT education.   06/14/2021 Working on all exercises given   2 Pt will be able to demonstrate full elbow PROM in order to demonstrate functional improvement in UE  function for self-care and  house hold duties.   06/14/2021 Progressing towards full motion ongoing   3 Pt will be able to demonstrate full elbow AROM in order to demonstrate functional improvement in UE function for self-care and house hold duties.   06/14/2021 Started today  Ongoing   4 Pt will score at least 9 pt increase on FOTO to demonstrate functional improvement in MCII and pt perceived function.    06/14/2021 Will assess soon  Ongoig     LONG TERM GOALS:    LTG Name Target Date Goal status  1 Pt  will become independent with final HEP in order to demonstrate synthesis of PT education.   07/26/2021 INITIAL  2 Pt will be able to carry/hold >10 lbs in order to demonstrate functional improvement in R UE strength for return to PLOF and exercise.  07/26/2021 INITIAL  3 Pt will score >/= 60 on FOTO to demonstrate improvement in perceived R elbow function.   07/26/2021 INITIAL  4 Pt will be able to demonstrate ability to simulate work related tasks without limitation in order to demonstrate functional improvement in UE/LE function for self-care and house hold duties.   07/26/2021 INITIAL    PLAN: PT FREQUENCY: 1-2x/week   PT DURATION: 12 weeks    PLANNED INTERVENTIONS: Therapeutic exercises, Therapeutic activity, Neuro Muscular re-education, Patient/Family education, Joint mobilization, Aquatic Therapy, Dry Needling, Electrical stimulation, Spinal mobilization, Cryotherapy, Moist heat, Compression bandaging, scar mobilization, Taping, Vasopneumatic device, Traction, Ultrasound, and Manual therapy   PLAN FOR NEXT SESSION: review HEP, PROM elbow, update HEP  Carney Living PT DPT  05/23/2021, 3:12 PM

## 2021-05-24 ENCOUNTER — Ambulatory Visit (INDEPENDENT_AMBULATORY_CARE_PROVIDER_SITE_OTHER): Payer: BC Managed Care – PPO | Admitting: Orthopaedic Surgery

## 2021-05-24 ENCOUNTER — Encounter (HOSPITAL_BASED_OUTPATIENT_CLINIC_OR_DEPARTMENT_OTHER): Payer: Self-pay | Admitting: Physical Therapy

## 2021-05-24 DIAGNOSIS — S46219A Strain of muscle, fascia and tendon of other parts of biceps, unspecified arm, initial encounter: Secondary | ICD-10-CM

## 2021-05-24 NOTE — Progress Notes (Signed)
Post Operative Evaluation    Procedure/Date of Surgery: Status post right distal biceps repair on 04/20/2021  Interval History:   05/24/2021: Presents today 4 weeks follow-up of his right distal biceps repair overall he is doing well.  The dorsal aspect wound has healed.  He is working on regaining his terminal motion at this time.  He is still in his brace. PMH/PSH/Family History/Social History/Meds/Allergies:    Past Medical History:  Diagnosis Date   Hypertension    Post traumatic stress disorder    Sleep apnea    Stroke [facility])    TIA 2021   Past Surgical History:  Procedure Laterality Date   APPENDECTOMY     DISTAL BICEPS TENDON REPAIR Right 04/20/2021   Procedure: RIGHT DISTAL BICEPS REPAIR;  Surgeon: [name], MD;  Location: [place];  Service: Orthopedics;  Laterality: Right;   FOOT SURGERY Bilateral    plantar fascitis   Social History   Socioeconomic History   Marital status: Married    Spouse name: Not on file   Number of children: Not on file   Years of education: Not on file   Highest education level: Not on file  Occupational History   Not on file  Tobacco Use   Smoking status: Every Day    Packs/day: 0.50    Years: 30.00    Pack years: 15.00    Types: Cigarettes   Smokeless tobacco: Never  Vaping Use   Vaping Use: Never used  Substance and Sexual Activity   Alcohol use: Not Currently    Comment: occ   Drug use: No   Sexual activity: Not on file  Other Topics Concern   Not on file  Social History Narrative   Not on file   Social Determinants of Health   Financial Resource Strain: Not on file  Food Insecurity: Not on file  Transportation Needs: Not on file  Physical Activity: Not on file  Stress: Not on file  Social Connections: Not on file   No family history on file. Allergies  Allergen Reactions   Hydrocodone Nausea And Vomiting   Oxycodone Nausea Only   Simvastatin Rash   Current Outpatient  Medications  Medication Sig Dispense Refill   aspirin EC 325 MG tablet Take 1 tablet (325 mg total) by mouth daily. 30 tablet 0   atorvastatin (LIPITOR) 40 MG tablet Take 1 tablet (40 mg total) by mouth daily. 90 tablet 3   Clobetasol Prop Emollient Base 0.05 % emollient cream Apply 1 application topically at bedtime. (Patient not taking: Reported on 04/17/2021) 15 g 5   propranolol ER (INDERAL LA) 80 MG 24 hr capsule Take 1 capsule (80 mg total) by mouth daily. 90 capsule 3   QUEtiapine (SEROQUEL) 100 MG tablet Take 100 mg by mouth at bedtime.     Rimegepant Sulfate (NURTEC) 75 MG TBDP Take 75 mg by mouth as needed (For acute migraine relief). (Patient not taking: Reported on 04/17/2021) 4 tablet 0   No current facility-administered medications for this visit.   No results found.  Review of Systems:   A ROS was performed including pertinent positives and negatives as documented in the HPI.   Musculoskeletal Exam:    There were no vitals taken for this visit.  Incision is well-healing and well-appearing.  He is able to  fire EPL as well as wrist extensors.  His dorsal forearm wound has now scabbed over completely.  Range of motion is from 10 degrees to 120 without difficulty Imaging:    None  I personally reviewed and interpreted the radiographs.   Assessment:   56 year old male 4-week status post right distal biceps repair overall doing very well from a distal biceps perspective.  At this time I have advised that he should get out of his brace and work on his terminal range of motion.  Still has a 5 pound lifting restriction.  I will see him back in 2 weeks and we will discuss at that point if he is ready for work  Plan :    -Return to clinic in 2 weeks     I personally saw and evaluated the patient, and participated in the management and treatment plan.  [name], MD Attending Physician, Orthopedic Surgery  This document was dictated using Dragon voice recognition  software. A reasonable attempt at proof reading has been made to minimize errors.

## 2021-05-29 ENCOUNTER — Other Ambulatory Visit: Payer: Self-pay

## 2021-05-29 ENCOUNTER — Ambulatory Visit (HOSPITAL_BASED_OUTPATIENT_CLINIC_OR_DEPARTMENT_OTHER): Payer: BC Managed Care – PPO | Admitting: Physical Therapy

## 2021-05-29 ENCOUNTER — Encounter (HOSPITAL_BASED_OUTPATIENT_CLINIC_OR_DEPARTMENT_OTHER): Payer: Self-pay | Admitting: Physical Therapy

## 2021-05-29 DIAGNOSIS — M6281 Muscle weakness (generalized): Secondary | ICD-10-CM | POA: Diagnosis not present

## 2021-05-29 DIAGNOSIS — M25621 Stiffness of right elbow, not elsewhere classified: Secondary | ICD-10-CM

## 2021-05-29 DIAGNOSIS — M25521 Pain in right elbow: Secondary | ICD-10-CM | POA: Diagnosis not present

## 2021-05-29 NOTE — Therapy (Signed)
OUTPATIENT PHYSICAL THERAPY TREATMENT NOTE   Patient Name: Ryan Middleton MRN: [id number] DOB:June 23, 1965, 56 y.o., male Today's Date: 05/29/2021  PCP: [facility] - 05/29/21 1556     Visit Number 5    Number of Visits 22    Date for PT Re-Evaluation 08/01/21    Authorization Type BC[employer]    PT Start Time 1600    PT Stop Time 1640    PT Time Calculation (min) 40 min    Activity Tolerance Patient tolerated treatment well    Behavior During Therapy [facility] for tasks assessed/performed               Past Medical History:  Diagnosis Date   Hypertension    Post traumatic stress disorder    Sleep apnea    Stroke [facility])    TIA 2021   Past Surgical History:  Procedure Laterality Date   APPENDECTOMY     DISTAL BICEPS TENDON REPAIR Right 04/20/2021   Procedure: RIGHT DISTAL BICEPS REPAIR;  Surgeon: [name], MD;  Location: [place];  Service: Orthopedics;  Laterality: Right;   FOOT SURGERY Bilateral    plantar fascitis   Patient Active Problem List   Diagnosis Date Noted   Biceps tendon tear    Pain in right shoulder 03/01/2021   Pain in right elbow 03/01/2021   TIA (transient ischemic attack) 07/25/2020   [facility]: [facility]   REFERRING PROVIDER: [name], MD   REFERRING DIAG: [phone] (ICD-10-CM) - Biceps tendon tear    THERAPY DIAG:  Pain in right elbow   Stiffness of right elbow, not elsewhere classified   Muscle weakness (generalized)     ONSET DATE: 04/20/2021     SUBJECTIVE:                                                                                                                                                                                       SUBJECTIVE STATEMENT: Pt states he is doing well. He has been working on straightening the elbow and is almost there. He still feels that pronation is still stiff. Overall, he feels much better.  PERTINENT HISTORY: TIA,  bilateral shoulder subluxation history*   PAIN:  Are you having pain? No VAS scale: 0/10     PRECAUTIONS: Other: R elbow   WEIGHT BEARING RESTRICTIONS Yes NWB for first 2 weeks   He may progress to full extension over the course of the next 2 weeks (per last MD note)   FALLS:  Has patient fallen in last 6 months? No Number of falls: 0   LIVING ENVIRONMENT:  Lives with: lives with their family Lives in: House/apartment   OCCUPATION: Works in maintenance   PLOF: [place] : Pt states he wants to be able to get back to work.    OBJECTIVE:    Full elbow ROM achieved 0 to 120   PATIENT SURVEYS:  FOTO 37 D/C 60 MCII 9pts   TODAY'S TREATMENT:   1/24 STM R supinator and brachioradialis Joint mobs: R radial head AP and PA grade III in open pack  Self massage and mob techinque demo'd and return demo'd  Standing Bicep Stretch at Wall - 3 reps - 30 hold Wrist Flexion AROM - 2 sets - 10 reps Wrist Flexion Extension AROM with Fingers Curled and Palm Down -2 sets - 10 reps Seated Forearm Pronation and Supination AROM -2 sets - 10 reps Wrist Extensor Stretch With Elbow Flexed: Progression From Elbow at Side -3 reps - 30 hold  1/18  shoulder flexion & ABD in brace 2x10 Shoulder isometrics at door 3s 10x- flexion, ABD, IR, ER, ext Wrist Extension AROM 20 reps/ flexion x20 each 2 lb weight  AROM flexion /extension 3x10   Active pronation/ supination x20  Manual gentle PROM into flexion and extension, pronation/supination to tolerance-  minor discomfort with end range pronation     1/11  shoulder flexion & ABD in brace 2x10 Shoulder isometrics at door 3s 10x- flexion, ABD, IR, ER, ext Wrist Extension AROM 20 reps  Manual gentle PROM into flexion and extension, pronation/supination to tolerance- discomfort with supination at 90    1/6  Shoulder Rolls in Sitting - 5-6 x daily 20 reps  Wrist Extension AROM 20 reps Wrist Flexion AROM - 5-6 x daily   20 reps Putty: Key grip 2x10 yellow  Pincer grip x20  Grip x20   Manual gentle PROM into flexion and extension/ supination     Eval   Exercises Shoulder Rolls in Sitting - 5-6 x daily - 7 x weekly - 1 sets - 20 reps Seated Gripping Towel - 5-6 x daily - 7 x weekly - 1 sets - 20 reps Wrist Extension AROM - 5-6 x daily - 7 x weekly - 1 sets - 20 reps Wrist Flexion AROM - 5-6 x daily - 7 x weekly - 1 sets - 20 reps   Attempted self PROM but pt unable to perform without AAROM     PATIENT EDUCATION: Education details: precautions, edema management, precautions, anatomy, exercise progression, DOMS expectations, muscle firing,  envelope of function, HEP, POC   Person educated: Patient Education method: Explanation, Demonstration, Tactile cues, Verbal cues, and Handouts Education comprehension: verbalized understanding, returned demonstration, verbal cues required, and tactile cues required     HOME EXERCISE PROGRAM: Access Code: [email or web address] URL: [email or web address] Date: 05/03/2021 Prepared by: Daleen Bo   ASSESSMENT:   CLINICAL IMPRESSION: Pt no longer wearing brace at this point per last MD note. Pt doing very well at this point from a rehab perspective. Pt presented to beginning of session with limited pronation ROM in extension and was able to reach full ROM supination and pronation after manual therapy. Pt was able to reach full elbow extension at today's session. HEP updated to maintain and improve elbow and wrist ROM. Pt with increased feeling of fatigue with wrist resistance exercise at this time so advised continue without resistance for HEP for now. Revisit at next session.   Objective impairments include decreased ROM, decreased strength, hypomobility, increased edema, increased fascial restrictions, increased muscle spasms, impaired  flexibility, impaired UE functional use, improper body mechanics, postural dysfunction, and pain. These impairments are limiting  patient from cleaning, community activity, driving, meal prep, occupation, laundry, yard work, shopping, and exercise and personal care . Personal factors including Age, Fitness, and Profession are also affecting patient's functional outcome. Patient will benefit from skilled PT to address above impairments and improve overall function.   REHAB POTENTIAL: Good   CLINICAL DECISION MAKING: Stable/uncomplicated   EVALUATION COMPLEXITY: Low     GOALS:   SHORT TERM GOALS:   STG Name Target Date Goal status 1/19  1 Pt will become independent with HEP in order to demonstrate synthesis of PT education.   06/14/2021 Working on all exercises given   2 Pt will be able to demonstrate full elbow PROM in order to demonstrate functional improvement in UE function for self-care and house hold duties.   06/14/2021 Progressing towards full motion ongoing   3 Pt will be able to demonstrate full elbow AROM in order to demonstrate functional improvement in UE function for self-care and house hold duties.   06/14/2021 Started today  Ongoing   4 Pt will score at least 9 pt increase on FOTO to demonstrate functional improvement in MCII and pt perceived function.    06/14/2021 Will assess soon  Ongoig     LONG TERM GOALS:    LTG Name Target Date Goal status  1 Pt  will become independent with final HEP in order to demonstrate synthesis of PT education.   07/26/2021 INITIAL  2 Pt will be able to carry/hold >10 lbs in order to demonstrate functional improvement in R UE strength for return to PLOF and exercise.  07/26/2021 INITIAL  3 Pt will score >/= 60 on FOTO to demonstrate improvement in perceived R elbow function.   07/26/2021 INITIAL  4 Pt will be able to demonstrate ability to simulate work related tasks without limitation in order to demonstrate functional improvement in UE/LE function for self-care and house hold duties.   07/26/2021 INITIAL    PLAN: PT FREQUENCY: 1-2x/week   PT DURATION: 12 weeks     PLANNED INTERVENTIONS: Therapeutic exercises, Therapeutic activity, Neuro Muscular re-education, Patient/Family education, Joint mobilization, Aquatic Therapy, Dry Needling, Electrical stimulation, Spinal mobilization, Cryotherapy, Moist heat, Compression bandaging, scar mobilization, Taping, Vasopneumatic device, Traction, Ultrasound, and Manual therapy   PLAN FOR NEXT SESSION: review HEP, PROM elbow, STM/joint mobs, wrist resisted exercise- no resisted supination   Daleen Bo PT DPT  05/29/2021, 4:51 PM

## 2021-05-31 ENCOUNTER — Ambulatory Visit (HOSPITAL_BASED_OUTPATIENT_CLINIC_OR_DEPARTMENT_OTHER): Payer: BC Managed Care – PPO | Admitting: Physical Therapy

## 2021-06-04 ENCOUNTER — Ambulatory Visit (HOSPITAL_BASED_OUTPATIENT_CLINIC_OR_DEPARTMENT_OTHER): Payer: BC Managed Care – PPO | Admitting: Physical Therapy

## 2021-06-04 ENCOUNTER — Other Ambulatory Visit: Payer: Self-pay

## 2021-06-04 DIAGNOSIS — M25521 Pain in right elbow: Secondary | ICD-10-CM

## 2021-06-04 DIAGNOSIS — M6281 Muscle weakness (generalized): Secondary | ICD-10-CM | POA: Diagnosis not present

## 2021-06-04 DIAGNOSIS — M25621 Stiffness of right elbow, not elsewhere classified: Secondary | ICD-10-CM | POA: Diagnosis not present

## 2021-06-04 NOTE — Therapy (Signed)
OUTPATIENT PHYSICAL THERAPY TREATMENT NOTE   Patient Name: Ryan Middleton MRN: [id number] DOB:Jan 13, 1966, 56 y.o., male Today's Date: 06/04/2021  PCP: [facility] REFERRING PROVIDER: [facility]       Past Medical History:  Diagnosis Date   Hypertension    Post traumatic stress disorder    Sleep apnea    Stroke [facility])    TIA 2021   Past Surgical History:  Procedure Laterality Date   APPENDECTOMY     DISTAL BICEPS TENDON REPAIR Right 04/20/2021   Procedure: RIGHT DISTAL BICEPS REPAIR;  Surgeon: [name], MD;  Location: [place];  Service: Orthopedics;  Laterality: Right;   FOOT SURGERY Bilateral    plantar fascitis   Patient Active Problem List   Diagnosis Date Noted   Biceps tendon tear    Pain in right shoulder 03/01/2021   Pain in right elbow 03/01/2021   TIA (transient ischemic attack) 07/25/2020   PCP: [facility]   REFERRING PROVIDER: [name], MD   REFERRING DIAG: [phone] (ICD-10-CM) - Biceps tendon tear    THERAPY DIAG:  Pain in right elbow   Stiffness of right elbow, not elsewhere classified   Muscle weakness (generalized)     ONSET DATE: 04/20/2021     SUBJECTIVE:                                                                                                                                                                                       SUBJECTIVE STATEMENT: Patient reports he feels like the lebow is going all the way straight. The stiffness with rotation has improved.   PERTINENT HISTORY: TIA, bilateral shoulder subluxation history*   PAIN:  Are you having pain? No VAS scale: 0/10     PRECAUTIONS: Other: R elbow   WEIGHT BEARING RESTRICTIONS Yes NWB for first 2 weeks   He may progress to full extension over the course of the next 2 weeks (per last MD note)   FALLS:  Has patient fallen in last 6 months? No Number of falls: 0   LIVING ENVIRONMENT: Lives with: lives with their family Lives  in: House/apartment   OCCUPATION: Works in maintenance   PLOF: [place] : Pt states he wants to be able to get back to work.    OBJECTIVE:    Full elbow ROM achieved 0 to 120   PATIENT SURVEYS:  FOTO 37 D/C 60 MCII 9pts   TODAY'S TREATMENT:  1/30 Manual: Protonation and supination PROM R radial head AP and PA grade III in open pack  Wrist flexion and extensin 2lb 2x20  Biceps flexion 2x20  Pronation/ supination 2lbs  2x20  Row yellow band x20  Shoulder extension in limited range yellow band x20 yellow.   Shoulder flexion 1lb x20  Shoulder scaption 1lb x20    1/24 STM R supinator and brachioradialis Joint mobs: R radial head AP and PA grade III in open pack  Self massage and mob techinque demo'd and return demo'd  Standing Bicep Stretch at Wall - 3 reps - 30 hold Wrist Flexion AROM - 2 sets - 10 reps Wrist Flexion Extension AROM with Fingers Curled and Palm Down -2 sets - 10 reps Seated Forearm Pronation and Supination AROM -2 sets - 10 reps Wrist Extensor Stretch With Elbow Flexed: Progression From Elbow at Side -3 reps - 30 hold  1/18  shoulder flexion & ABD in brace 2x10 Shoulder isometrics at door 3s 10x- flexion, ABD, IR, ER, ext Wrist Extension AROM 20 reps/ flexion x20 each 2 lb weight  AROM flexion /extension 3x10   Active pronation/ supination x20  Manual gentle PROM into flexion and extension, pronation/supination to tolerance-  minor discomfort with end range pronation     1/11  shoulder flexion & ABD in brace 2x10 Shoulder isometrics at door 3s 10x- flexion, ABD, IR, ER, ext Wrist Extension AROM 20 reps  Manual gentle PROM into flexion and extension, pronation/supination to tolerance- discomfort with supination at 90    1/6  Shoulder Rolls in Sitting - 5-6 x daily 20 reps  Wrist Extension AROM 20 reps Wrist Flexion AROM - 5-6 x daily  20 reps Putty: Key grip 2x10 yellow  Pincer grip x20  Grip x20   Manual gentle  PROM into flexion and extension/ supination     Eval   Exercises Shoulder Rolls in Sitting - 5-6 x daily - 7 x weekly - 1 sets - 20 reps Seated Gripping Towel - 5-6 x daily - 7 x weekly - 1 sets - 20 reps Wrist Extension AROM - 5-6 x daily - 7 x weekly - 1 sets - 20 reps Wrist Flexion AROM - 5-6 x daily - 7 x weekly - 1 sets - 20 reps   Attempted self PROM but pt unable to perform without AAROM     PATIENT EDUCATION: Education details: precautions, edema management, precautions, anatomy, exercise progression, DOMS expectations, muscle firing,  envelope of function, HEP, POC   Person educated: Patient Education method: Explanation, Demonstration, Tactile cues, Verbal cues, and Handouts Education comprehension: verbalized understanding, returned demonstration, verbal cues required, and tactile cues required     HOME EXERCISE PROGRAM: Access Code: [email or web address] URL: [email or web address] Date: 05/03/2021 Prepared by: Daleen Bo   ASSESSMENT:   CLINICAL IMPRESSION: Pustient demonstrated full ROM after manual therapy. He had minor limitations in pronation/ supination prior to manual therapy. Therapy advanced him to light exercises today. He has a 5lb lifting limitation per MD note. He has a 5lb lifting restriction per MD note. Therapy will assess tolerance to light exercises.    Objective impairments include decreased ROM, decreased strength, hypomobility, increased edema, increased fascial restrictions, increased muscle spasms, impaired flexibility, impaired UE functional use, improper body mechanics, postural dysfunction, and pain. These impairments are limiting patient from cleaning, community activity, driving, meal prep, occupation, laundry, yard work, shopping, and exercise and personal care . Personal factors including Age, Fitness, and Profession are also affecting patient's functional outcome. Patient will benefit from skilled PT to address above impairments and improve  overall function.   REHAB POTENTIAL: Good   CLINICAL DECISION MAKING: Stable/uncomplicated   EVALUATION COMPLEXITY: Low  GOALS:   SHORT TERM GOALS:   STG Name Target Date Goal status 1/19  1 Pt will become independent with HEP in order to demonstrate synthesis of PT education.   06/14/2021 Working on all exercises given   2 Pt will be able to demonstrate full elbow PROM in order to demonstrate functional improvement in UE function for self-care and house hold duties.   06/14/2021 Progressing towards full motion ongoing   3 Pt will be able to demonstrate full elbow AROM in order to demonstrate functional improvement in UE function for self-care and house hold duties.   06/14/2021 Started today  Ongoing   4 Pt will score at least 9 pt increase on FOTO to demonstrate functional improvement in MCII and pt perceived function.    06/14/2021 Will assess soon  Ongoig     LONG TERM GOALS:    LTG Name Target Date Goal status  1 Pt  will become independent with final HEP in order to demonstrate synthesis of PT education.   07/26/2021 INITIAL  2 Pt will be able to carry/hold >10 lbs in order to demonstrate functional improvement in R UE strength for return to PLOF and exercise.  07/26/2021 INITIAL  3 Pt will score >/= 60 on FOTO to demonstrate improvement in perceived R elbow function.   07/26/2021 INITIAL  4 Pt will be able to demonstrate ability to simulate work related tasks without limitation in order to demonstrate functional improvement in UE/LE function for self-care and house hold duties.   07/26/2021 INITIAL    PLAN: PT FREQUENCY: 1-2x/week   PT DURATION: 12 weeks    PLANNED INTERVENTIONS: Therapeutic exercises, Therapeutic activity, Neuro Muscular re-education, Patient/Family education, Joint mobilization, Aquatic Therapy, Dry Needling, Electrical stimulation, Spinal mobilization, Cryotherapy, Moist heat, Compression bandaging, scar mobilization, Taping, Vasopneumatic device, Traction,  Ultrasound, and Manual therapy   PLAN FOR NEXT SESSION: review HEP, PROM elbow, STM/joint mobs, wrist resisted exercise- no resisted supination   Carney Living PT DPT  06/04/2021, 4:13 PM

## 2021-06-05 ENCOUNTER — Encounter (HOSPITAL_BASED_OUTPATIENT_CLINIC_OR_DEPARTMENT_OTHER): Payer: Self-pay | Admitting: Physical Therapy

## 2021-06-06 ENCOUNTER — Ambulatory Visit (HOSPITAL_BASED_OUTPATIENT_CLINIC_OR_DEPARTMENT_OTHER): Payer: BC Managed Care – PPO | Attending: Orthopaedic Surgery | Admitting: Physical Therapy

## 2021-06-06 ENCOUNTER — Other Ambulatory Visit: Payer: Self-pay

## 2021-06-06 DIAGNOSIS — M6281 Muscle weakness (generalized): Secondary | ICD-10-CM | POA: Diagnosis not present

## 2021-06-06 DIAGNOSIS — M25521 Pain in right elbow: Secondary | ICD-10-CM | POA: Insufficient documentation

## 2021-06-06 DIAGNOSIS — M25621 Stiffness of right elbow, not elsewhere classified: Secondary | ICD-10-CM | POA: Insufficient documentation

## 2021-06-06 NOTE — Therapy (Signed)
OUTPATIENT PHYSICAL THERAPY TREATMENT NOTE   Patient Name: Ryan Middleton MRN: [id number] DOB:[birth year], 56 y.o., male Today's Date: 06/06/2021  PCP: [facility] - 06/06/21 1559     Visit Number 7    Number of Visits 22    Date for PT Re-Evaluation 08/01/21    Authorization Type BCBS    PT Start Time 1555    PT Stop Time 1625    PT Time Calculation (min) 30 min    Activity Tolerance Patient tolerated treatment well    Behavior During Therapy [facility] for tasks assessed/performed                Past Medical History:  Diagnosis Date   Hypertension    Post traumatic stress disorder    Sleep apnea    Stroke [facility])    TIA 2021   Past Surgical History:  Procedure Laterality Date   APPENDECTOMY     DISTAL BICEPS TENDON REPAIR Right 04/20/2021   Procedure: RIGHT DISTAL BICEPS REPAIR;  Surgeon: [name], MD;  Location: [place];  Service: Orthopedics;  Laterality: Right;   FOOT SURGERY Bilateral    plantar fascitis   Patient Active Problem List   Diagnosis Date Noted   Biceps tendon tear    Pain in right shoulder 03/01/2021   Pain in right elbow 03/01/2021   TIA (transient ischemic attack) 07/25/2020   PCP: [facility]   REFERRING PROVIDER: [name], MD   REFERRING DIAG: [phone] (ICD-10-CM) - Biceps tendon tear    THERAPY DIAG:  Pain in right elbow   Stiffness of right elbow, not elsewhere classified   Muscle weakness (generalized)     ONSET DATE: 04/20/2021     SUBJECTIVE:                                                                                                                                                                                       SUBJECTIVE STATEMENT: Pt states that there is is only stiffness in the elbow with pronation. "It feels way better with massage." He has some soreness in the incision area.   PERTINENT HISTORY: TIA, bilateral shoulder  subluxation history*   PAIN:  Are you having pain? No VAS scale: 0/10     PRECAUTIONS: Other: R elbow   WEIGHT BEARING RESTRICTIONS Yes NWB for first 2 weeks   He may progress to full extension over the course of the next 2 weeks (per last MD note)   FALLS:  Has patient fallen in last 6 months? No Number of falls: 0   LIVING ENVIRONMENT: Lives with:  lives with their family Lives in: House/apartment   OCCUPATION: Works in maintenance   PLOF: [place] : Pt states he wants to be able to get back to work.    OBJECTIVE:    Full elbow ROM achieved 0 to 120   PATIENT SURVEYS:  FOTO 37 D/C 60 MCII 9pts    FOTO 7th Visit 59 pts   TODAY'S TREATMENT:   2/1 Manual: STM brachioradialis R radial head AP and PA grade IV in open pack  Biceps flexion 2x20  Row yellow band x20  Shoulder extension in limited range yellow band x20 yellow.   Shoulder flexion 1lb x20  Shoulder scaption 1lb x20   1/30 Manual: Protonation and supination PROM R radial head AP and PA grade III in open pack  Wrist flexion and extensin 2lb 2x20  Biceps flexion 2x20  Pronation/ supination 2lbs 2x20  Row yellow band x20  Shoulder extension in limited range yellow band x20 yellow.   Shoulder flexion 1lb x20  Shoulder scaption 1lb x20    1/24 STM R supinator and brachioradialis Joint mobs: R radial head AP and PA grade III in open pack  Self massage and mob techinque demo'd and return demo'd  Standing Bicep Stretch at Wall - 3 reps - 30 hold Wrist Flexion AROM - 2 sets - 10 reps Wrist Flexion Extension AROM with Fingers Curled and Palm Down -2 sets - 10 reps Seated Forearm Pronation and Supination AROM -2 sets - 10 reps Wrist Extensor Stretch With Elbow Flexed: Progression From Elbow at Side -3 reps - 30 hold  1/18  shoulder flexion & ABD in brace 2x10 Shoulder isometrics at door 3s 10x- flexion, ABD, IR, ER, ext Wrist Extension AROM 20 reps/ flexion x20 each 2 lb  weight  AROM flexion /extension 3x10   Active pronation/ supination x20  Manual gentle PROM into flexion and extension, pronation/supination to tolerance-  minor discomfort with end range pronation     1/11  shoulder flexion & ABD in brace 2x10 Shoulder isometrics at door 3s 10x- flexion, ABD, IR, ER, ext Wrist Extension AROM 20 reps  Manual gentle PROM into flexion and extension, pronation/supination to tolerance- discomfort with supination at 90    1/6  Shoulder Rolls in Sitting - 5-6 x daily 20 reps  Wrist Extension AROM 20 reps Wrist Flexion AROM - 5-6 x daily  20 reps Putty: Key grip 2x10 yellow  Pincer grip x20  Grip x20   Manual gentle PROM into flexion and extension/ supination     Eval   Exercises Shoulder Rolls in Sitting - 5-6 x daily - 7 x weekly - 1 sets - 20 reps Seated Gripping Towel - 5-6 x daily - 7 x weekly - 1 sets - 20 reps Wrist Extension AROM - 5-6 x daily - 7 x weekly - 1 sets - 20 reps Wrist Flexion AROM - 5-6 x daily - 7 x weekly - 1 sets - 20 reps   Attempted self PROM but pt unable to perform without AAROM     PATIENT EDUCATION: Education details: precautions, edema management, precautions, anatomy, exercise progression, DOMS expectations, muscle firing,  envelope of function, HEP, POC   Person educated: Patient Education method: Explanation, Demonstration, Tactile cues, Verbal cues, and Handouts Education comprehension: verbalized understanding, returned demonstration, verbal cues required, and tactile cues required     HOME EXERCISE PROGRAM: Access Code: [email or web address] URL: [email or web address] Date: 05/03/2021 Prepared by: Daleen Bo   ASSESSMENT:   CLINICAL  IMPRESSION: Pt with report of good tolerance to exercise from previous session with significant increase in soreness. Pt does report fatigue with light scapular resistance without pain or significant tightness into repair. Pt was able to reach full pronation at end of  session following manual. HEP updated to include light resistance. He has a 5lb lifting restriction per MD note. Pt to see MD 06/07/21 for follow up and progress check. Plan to continue with light resistance as tolerated. Pt to decrease frequency to 1x/week due to reaching current protocol limits.   Objective impairments include decreased ROM, decreased strength, hypomobility, increased edema, increased fascial restrictions, increased muscle spasms, impaired flexibility, impaired UE functional use, improper body mechanics, postural dysfunction, and pain. These impairments are limiting patient from cleaning, community activity, driving, meal prep, occupation, laundry, yard work, shopping, and exercise and personal care . Personal factors including Age, Fitness, and Profession are also affecting patient's functional outcome. Patient will benefit from skilled PT to address above impairments and improve overall function.   REHAB POTENTIAL: Good   CLINICAL DECISION MAKING: Stable/uncomplicated   EVALUATION COMPLEXITY: Low     GOALS:   SHORT TERM GOALS:   STG Name Target Date Goal status 1/19  1 Pt will become independent with HEP in order to demonstrate synthesis of PT education.   06/14/2021 Working on all exercises given   2 Pt will be able to demonstrate full elbow PROM in order to demonstrate functional improvement in UE function for self-care and house hold duties.   06/14/2021 Progressing towards full motion ongoing   3 Pt will be able to demonstrate full elbow AROM in order to demonstrate functional improvement in UE function for self-care and house hold duties.   06/14/2021 Started today  Ongoing   4 Pt will score at least 9 pt increase on FOTO to demonstrate functional improvement in MCII and pt perceived function.    06/14/2021 Will assess soon  Ongoig     LONG TERM GOALS:    LTG Name Target Date Goal status  1 Pt  will become independent with final HEP in order to demonstrate synthesis of  PT education.   07/26/2021 INITIAL  2 Pt will be able to carry/hold >10 lbs in order to demonstrate functional improvement in R UE strength for return to PLOF and exercise.  07/26/2021 INITIAL  3 Pt will score >/= 60 on FOTO to demonstrate improvement in perceived R elbow function.   07/26/2021 INITIAL  4 Pt will be able to demonstrate ability to simulate work related tasks without limitation in order to demonstrate functional improvement in UE/LE function for self-care and house hold duties.   07/26/2021 INITIAL    PLAN: PT FREQUENCY: 1-2x/week   PT DURATION: 12 weeks    PLANNED INTERVENTIONS: Therapeutic exercises, Therapeutic activity, Neuro Muscular re-education, Patient/Family education, Joint mobilization, Aquatic Therapy, Dry Needling, Electrical stimulation, Spinal mobilization, Cryotherapy, Moist heat, Compression bandaging, scar mobilization, Taping, Vasopneumatic device, Traction, Ultrasound, and Manual therapy   PLAN FOR NEXT SESSION: review HEP, STM/joint mobs, light resistance progression at shoulder   Daleen Bo PT DPT  06/06/2021, 4:40 PM

## 2021-06-07 ENCOUNTER — Ambulatory Visit (INDEPENDENT_AMBULATORY_CARE_PROVIDER_SITE_OTHER): Payer: BC Managed Care – PPO | Admitting: Orthopaedic Surgery

## 2021-06-07 DIAGNOSIS — S46219A Strain of muscle, fascia and tendon of other parts of biceps, unspecified arm, initial encounter: Secondary | ICD-10-CM

## 2021-06-07 NOTE — Progress Notes (Signed)
Post Operative Evaluation    Procedure/Date of Surgery: Status post right distal biceps repair on 04/20/2021  Interval History:   06/07/2021: Presents today for follow-up.  His right dorsal wound continues to improve dramatically.  His motion has normalized significantly.  Feeling minimal pain and doing much better PMH/PSH/Family History/Social History/Meds/Allergies:    Past Medical History:  Diagnosis Date   Hypertension    Post traumatic stress disorder    Sleep apnea    Stroke [facility])    TIA 2021   Past Surgical History:  Procedure Laterality Date   APPENDECTOMY     DISTAL BICEPS TENDON REPAIR Right 04/20/2021   Procedure: RIGHT DISTAL BICEPS REPAIR;  Surgeon: [name], MD;  Location: [place];  Service: Orthopedics;  Laterality: Right;   FOOT SURGERY Bilateral    plantar fascitis   Social History   Socioeconomic History   Marital status: Married    Spouse name: Not on file   Number of children: Not on file   Years of education: Not on file   Highest education level: Not on file  Occupational History   Not on file  Tobacco Use   Smoking status: Every Day    Packs/day: 0.50    Years: 30.00    Pack years: 15.00    Types: Cigarettes   Smokeless tobacco: Never  Vaping Use   Vaping Use: Never used  Substance and Sexual Activity   Alcohol use: Not Currently    Comment: occ   Drug use: No   Sexual activity: Not on file  Other Topics Concern   Not on file  Social History Narrative   Not on file   Social Determinants of Health   Financial Resource Strain: Not on file  Food Insecurity: Not on file  Transportation Needs: Not on file  Physical Activity: Not on file  Stress: Not on file  Social Connections: Not on file   No family history on file. Allergies  Allergen Reactions   Hydrocodone Nausea And Vomiting   Oxycodone Nausea Only   Simvastatin Rash   Current Outpatient Medications  Medication Sig Dispense Refill    aspirin EC 325 MG tablet Take 1 tablet (325 mg total) by mouth daily. 30 tablet 0   atorvastatin (LIPITOR) 40 MG tablet Take 1 tablet (40 mg total) by mouth daily. 90 tablet 3   Clobetasol Prop Emollient Base 0.05 % emollient cream Apply 1 application topically at bedtime. (Patient not taking: Reported on 04/17/2021) 15 g 5   propranolol ER (INDERAL LA) 80 MG 24 hr capsule Take 1 capsule (80 mg total) by mouth daily. 90 capsule 3   QUEtiapine (SEROQUEL) 100 MG tablet Take 100 mg by mouth at bedtime.     Rimegepant Sulfate (NURTEC) 75 MG TBDP Take 75 mg by mouth as needed (For acute migraine relief). (Patient not taking: Reported on 04/17/2021) 4 tablet 0   No current facility-administered medications for this visit.   No results found.  Review of Systems:   A ROS was performed including pertinent positives and negatives as documented in the HPI.   Musculoskeletal Exam:    There were no vitals taken for this visit.  Incision is well-healing and well-appearing.  He is able to fire EPL as well as wrist extensors.  His dorsal forearm wound has now scabbed  over completely.  Range of motion is from 0 degrees to 120 without difficulty.  Pro supination is near full Imaging:    None  I personally reviewed and interpreted the radiographs.   Assessment:   56 year old male 6-week status post right distal biceps repair overall doing very well from a distal biceps perspective.  At this time I would like him to work on a gradual strengthening program he may return to work as tolerated.  Work note provided.  He will see me back in 6 weeks Plan :    -Return to clinic in 6 weeks     I personally saw and evaluated the patient, and participated in the management and treatment plan.  [name], MD Attending Physician, Orthopedic Surgery  This document was dictated using Dragon voice recognition software. A reasonable attempt at proof reading has been made to minimize errors.

## 2021-06-12 ENCOUNTER — Other Ambulatory Visit: Payer: Self-pay

## 2021-06-12 ENCOUNTER — Ambulatory Visit (HOSPITAL_BASED_OUTPATIENT_CLINIC_OR_DEPARTMENT_OTHER): Payer: BC Managed Care – PPO | Attending: Orthopaedic Surgery | Admitting: Physical Therapy

## 2021-06-12 ENCOUNTER — Encounter (HOSPITAL_BASED_OUTPATIENT_CLINIC_OR_DEPARTMENT_OTHER): Payer: Self-pay | Admitting: Physical Therapy

## 2021-06-12 DIAGNOSIS — M6281 Muscle weakness (generalized): Secondary | ICD-10-CM | POA: Diagnosis not present

## 2021-06-12 DIAGNOSIS — M25621 Stiffness of right elbow, not elsewhere classified: Secondary | ICD-10-CM | POA: Diagnosis not present

## 2021-06-12 DIAGNOSIS — M25521 Pain in right elbow: Secondary | ICD-10-CM | POA: Diagnosis not present

## 2021-06-12 NOTE — Therapy (Signed)
OUTPATIENT PHYSICAL THERAPY TREATMENT NOTE   Patient Name: Ryan Middleton MRN: [id number] DOB:[birth year], 56 y.o., male Today's Date: 06/12/2021  PCP: [place] [facility] - 06/12/21 1652     Visit Number 8    Number of Visits 22    Date for PT Re-Evaluation 08/01/21    Authorization Type BCBS    PT Start Time 1645    PT Stop Time 1720    PT Time Calculation (min) 35 min    Activity Tolerance Patient tolerated treatment well    Behavior During Therapy [facility] for tasks assessed/performed                 Past Medical History:  Diagnosis Date   Hypertension    Post traumatic stress disorder    Sleep apnea    Stroke [facility])    TIA 2021   Past Surgical History:  Procedure Laterality Date   APPENDECTOMY     DISTAL BICEPS TENDON REPAIR Right 04/20/2021   Procedure: RIGHT DISTAL BICEPS REPAIR;  Surgeon: [name], MD;  Location: [place];  Service: Orthopedics;  Laterality: Right;   FOOT SURGERY Bilateral    plantar fascitis   Patient Active Problem List   Diagnosis Date Noted   Biceps tendon tear    Pain in right shoulder 03/01/2021   Pain in right elbow 03/01/2021   TIA (transient ischemic attack) 07/25/2020   PCP: [facility]   REFERRING PROVIDER: [name], MD   REFERRING DIAG: [phone] (ICD-10-CM) - Biceps tendon tear    THERAPY DIAG:  Pain in right elbow   Stiffness of right elbow, not elsewhere classified   Muscle weakness (generalized)     ONSET DATE: 04/20/2021     SUBJECTIVE:                                                                                                                                                                                       SUBJECTIVE STATEMENT: Pt states he is back at work again. He did not do any lifting but just general activity caused some soreness. It is now gone. Pt states it is pretty much normal in terms of elbow rotational.     PERTINENT HISTORY: TIA, bilateral shoulder subluxation history*   PAIN:  Are you having pain? No VAS scale: 0/10     PRECAUTIONS: Other: R elbow   WEIGHT BEARING RESTRICTIONS Yes NWB for first 2 weeks   He may progress to full extension over the course of the next 2 weeks (per last MD note)   FALLS:  Has patient fallen in last 6  months? No Number of falls: 0   LIVING ENVIRONMENT: Lives with: lives with their family Lives in: House/apartment   OCCUPATION: Works in maintenance   PLOF: [place] : Pt states he wants to be able to get back to work.    OBJECTIVE:    Full elbow ROM achieved 0 to 120   PATIENT SURVEYS:  FOTO 37 D/C 60 MCII 9pts    FOTO 7th Visit 59 pts   TODAY'S TREATMENT:    2/7  Hammer curl 2lbs 4X8 Supination 2lbs 3x10 Row RTB band x20  Wrist flexion and extension stretch 30s 3x Wall push up 20x Bent over row 4lbs 2x15 Horizontal ABD RTB 3x10   2/1 Manual: STM brachioradialis R radial head AP and PA grade IV in open pack  Biceps flexion 2x20  Row yellow band x20  Shoulder extension in limited range yellow band x20 yellow.   Shoulder flexion 1lb x20  Shoulder scaption 1lb x20   1/30 Manual: Protonation and supination PROM R radial head AP and PA grade III in open pack  Wrist flexion and extensin 2lb 2x20  Biceps flexion 2x20  Pronation/ supination 2lbs 2x20  Row yellow band x20  Shoulder extension in limited range yellow band x20 yellow.   Shoulder flexion 1lb x20  Shoulder scaption 1lb x20    1/24 STM R supinator and brachioradialis Joint mobs: R radial head AP and PA grade III in open pack  Self massage and mob techinque demo'd and return demo'd  Standing Bicep Stretch at Wall - 3 reps - 30 hold Wrist Flexion AROM - 2 sets - 10 reps Wrist Flexion Extension AROM with Fingers Curled and Palm Down -2 sets - 10 reps Seated Forearm Pronation and Supination AROM -2 sets - 10 reps Wrist Extensor  Stretch With Elbow Flexed: Progression From Elbow at Side -3 reps - 30 hold    PATIENT EDUCATION: Education details: precautions, edema management, precautions, anatomy, exercise progression, DOMS expectations, muscle firing,  envelope of function, HEP, POC   Person educated: Patient Education method: Explanation, Demonstration, Tactile cues, Verbal cues, and Handouts Education comprehension: verbalized understanding, returned demonstration, verbal cues required, and tactile cues required     HOME EXERCISE PROGRAM: Access Code: [email or web address] URL: [email or web address] Date: 05/03/2021 Prepared by: Daleen Bo   ASSESSMENT:   CLINICAL IMPRESSION: Pt with good tolerance to progressing light resistance to elbow flexion as per MD note. Pt without pain during session and able to increase intensity of loading. Pt without pain after session. Plan to progress loading as able. Continue with 1x/week at this time.   Objective impairments include decreased ROM, decreased strength, hypomobility, increased edema, increased fascial restrictions, increased muscle spasms, impaired flexibility, impaired UE functional use, improper body mechanics, postural dysfunction, and pain. These impairments are limiting patient from cleaning, community activity, driving, meal prep, occupation, laundry, yard work, shopping, and exercise and personal care . Personal factors including Age, Fitness, and Profession are also affecting patient's functional outcome. Patient will benefit from skilled PT to address above impairments and improve overall function.   REHAB POTENTIAL: Good   CLINICAL DECISION MAKING: Stable/uncomplicated   EVALUATION COMPLEXITY: Low     GOALS:   SHORT TERM GOALS:   STG Name Target Date Goal status 1/19  1 Pt will become independent with HEP in order to demonstrate synthesis of PT education.   06/14/2021 Working on all exercises given   2 Pt will be able to demonstrate full elbow  PROM in order  to demonstrate functional improvement in UE function for self-care and house hold duties.   06/14/2021 Progressing towards full motion ongoing   3 Pt will be able to demonstrate full elbow AROM in order to demonstrate functional improvement in UE function for self-care and house hold duties.   06/14/2021 Started today  Ongoing   4 Pt will score at least 9 pt increase on FOTO to demonstrate functional improvement in MCII and pt perceived function.    06/14/2021 Will assess soon  Ongoig     LONG TERM GOALS:    LTG Name Target Date Goal status  1 Pt  will become independent with final HEP in order to demonstrate synthesis of PT education.   07/26/2021 INITIAL  2 Pt will be able to carry/hold >10 lbs in order to demonstrate functional improvement in R UE strength for return to PLOF and exercise.  07/26/2021 INITIAL  3 Pt will score >/= 60 on FOTO to demonstrate improvement in perceived R elbow function.   07/26/2021 INITIAL  4 Pt will be able to demonstrate ability to simulate work related tasks without limitation in order to demonstrate functional improvement in UE/LE function for self-care and house hold duties.   07/26/2021 INITIAL    PLAN: PT FREQUENCY: 1-2x/week   PT DURATION: 12 weeks    PLANNED INTERVENTIONS: Therapeutic exercises, Therapeutic activity, Neuro Muscular re-education, Patient/Family education, Joint mobilization, Aquatic Therapy, Dry Needling, Electrical stimulation, Spinal mobilization, Cryotherapy, Moist heat, Compression bandaging, scar mobilization, Taping, Vasopneumatic device, Traction, Ultrasound, and Manual therapy   PLAN FOR NEXT SESSION: review HEP, STM/joint mobs, light resistance progression at shoulder   Daleen Bo PT DPT  06/12/2021, 5:35 PM

## 2021-06-14 ENCOUNTER — Encounter (HOSPITAL_BASED_OUTPATIENT_CLINIC_OR_DEPARTMENT_OTHER): Payer: BC Managed Care – PPO | Admitting: Physical Therapy

## 2021-06-19 ENCOUNTER — Encounter (HOSPITAL_BASED_OUTPATIENT_CLINIC_OR_DEPARTMENT_OTHER): Payer: BC Managed Care – PPO | Admitting: Physical Therapy

## 2021-06-21 ENCOUNTER — Encounter (HOSPITAL_BASED_OUTPATIENT_CLINIC_OR_DEPARTMENT_OTHER): Payer: BC Managed Care – PPO | Admitting: Physical Therapy

## 2021-06-26 ENCOUNTER — Ambulatory Visit (HOSPITAL_BASED_OUTPATIENT_CLINIC_OR_DEPARTMENT_OTHER): Payer: BC Managed Care – PPO | Admitting: Physical Therapy

## 2021-06-26 ENCOUNTER — Other Ambulatory Visit: Payer: Self-pay

## 2021-06-26 ENCOUNTER — Encounter (HOSPITAL_BASED_OUTPATIENT_CLINIC_OR_DEPARTMENT_OTHER): Payer: Self-pay | Admitting: Physical Therapy

## 2021-06-26 DIAGNOSIS — M25521 Pain in right elbow: Secondary | ICD-10-CM

## 2021-06-26 DIAGNOSIS — M6281 Muscle weakness (generalized): Secondary | ICD-10-CM | POA: Diagnosis not present

## 2021-06-26 DIAGNOSIS — M25621 Stiffness of right elbow, not elsewhere classified: Secondary | ICD-10-CM

## 2021-06-26 NOTE — Therapy (Signed)
OUTPATIENT PHYSICAL THERAPY TREATMENT NOTE   Patient Name: Ryan Middleton MRN: [id number] DOB:[birth year], 56 y.o., male Today's Date: 06/26/2021  PCP: [place] [facility] - 06/26/21 1651     Visit Number 9    Number of Visits 22    Date for PT Re-Evaluation 08/01/21    Authorization Type BCBS    PT Start Time 1645    PT Stop Time 1710    PT Time Calculation (min) 25 min    Activity Tolerance Patient tolerated treatment well    Behavior During Therapy [facility] for tasks assessed/performed                  Past Medical History:  Diagnosis Date   Hypertension    Post traumatic stress disorder    Sleep apnea    Stroke [facility])    TIA 2021   Past Surgical History:  Procedure Laterality Date   APPENDECTOMY     DISTAL BICEPS TENDON REPAIR Right 04/20/2021   Procedure: RIGHT DISTAL BICEPS REPAIR;  Surgeon: [name], MD;  Location: [place];  Service: Orthopedics;  Laterality: Right;   FOOT SURGERY Bilateral    plantar fascitis   Patient Active Problem List   Diagnosis Date Noted   Biceps tendon tear    Pain in right shoulder 03/01/2021   Pain in right elbow 03/01/2021   TIA (transient ischemic attack) 07/25/2020   [facility]P: [facility]   REFERRING PROVIDER: [name], MD   REFERRING DIAG: [id number] (ICD-10-CM) - Biceps tendon tear    THERAPY DIAG:  Pain in right elbow   Stiffness of right elbow, not elsewhere classified   Muscle weakness (generalized)     ONSET DATE: 04/20/2021     SUBJECTIVE:                                                                                                                                                                                       SUBJECTIVE STATEMENT:  Pt states he is pretty much back to normal. He is about 95% back at work.   PERTINENT HISTORY: TIA, bilateral shoulder subluxation history*   PAIN:  Are you having pain? No VAS scale:  0/10     PRECAUTIONS: Other: R elbow   WEIGHT BEARING RESTRICTIONS Yes NWB for first 2 weeks   He may progress to full extension over the course of the next 2 weeks (per last MD note)   FALLS:  Has patient fallen in last 6 months? No Number of falls: 0   LIVING ENVIRONMENT: Lives with: lives with their family Lives in: House/apartment  OCCUPATION: Works in maintenance   PLOF: [place] : Pt states he wants to be able to get back to work.    OBJECTIVE:    Full elbow ROM achieved 0 to 120   PATIENT SURVEYS:  FOTO 37 D/C 60 MCII 9pts    FOTO 7th Visit 59 pts   TODAY'S TREATMENT:   2/21  Hammer and full supinated curl 5lbs 4X8 Supination 5lbs 3x10 Row GTB band x20  Bent over row 10 lbs 3x10 (back lead movement)  2/7  Hammer curl 2lbs 4X8 Supination 2lbs 3x10 Row RTB band x20  Wrist flexion and extension stretch 30s 3x Wall push up 20x Bent over row 4lbs 2x15 Horizontal ABD RTB 3x10   2/1 Manual: STM brachioradialis R radial head AP and PA grade IV in open pack  Biceps flexion 2x20  Row yellow band x20  Shoulder extension in limited range yellow band x20 yellow.   Shoulder flexion 1lb x20  Shoulder scaption 1lb x20   1/30 Manual: Protonation and supination PROM R radial head AP and PA grade III in open pack  Wrist flexion and extensin 2lb 2x20  Biceps flexion 2x20  Pronation/ supination 2lbs 2x20  Row yellow band x20  Shoulder extension in limited range yellow band x20 yellow.   Shoulder flexion 1lb x20  Shoulder scaption 1lb x20    1/24 STM R supinator and brachioradialis Joint mobs: R radial head AP and PA grade III in open pack  Self massage and mob techinque demo'd and return demo'd  Standing Bicep Stretch at Wall - 3 reps - 30 hold Wrist Flexion AROM - 2 sets - 10 reps Wrist Flexion Extension AROM with Fingers Curled and Palm Down -2 sets - 10 reps Seated Forearm Pronation and Supination AROM -2 sets - 10  reps Wrist Extensor Stretch With Elbow Flexed: Progression From Elbow at Side -3 reps - 30 hold    PATIENT EDUCATION: Education details: precautions, edema management, precautions, anatomy, exercise progression, DOMS expectations, muscle firing,  envelope of function, HEP, POC   Person educated: Patient Education method: Explanation, Demonstration, Tactile cues, Verbal cues, and Handouts Education comprehension: verbalized understanding, returned demonstration, verbal cues required, and tactile cues required     HOME EXERCISE PROGRAM: Access Code: [email or web address] URL: [email or web address] Date: 05/03/2021 Prepared by: Daleen Bo   ASSESSMENT:   CLINICAL IMPRESSION: Pt with full elbow ROM at this time and is at RPE 4 with current limit of 5lbs with elbow flexion. Pt has progressed very well with PT and is waiting to progress to next phase of therapy. Pt is 9 weeks out at this time. Pt's next visit will be in two weeks- plan to progress exercise. Pt has follow up with MD day after for clearance to return to work.   Objective impairments include decreased ROM, decreased strength, hypomobility, increased edema, increased fascial restrictions, increased muscle spasms, impaired flexibility, impaired UE functional use, improper body mechanics, postural dysfunction, and pain. These impairments are limiting patient from cleaning, community activity, driving, meal prep, occupation, laundry, yard work, shopping, and exercise and personal care . Personal factors including Age, Fitness, and Profession are also affecting patient's functional outcome. Patient will benefit from skilled PT to address above impairments and improve overall function.   REHAB POTENTIAL: Good   CLINICAL DECISION MAKING: Stable/uncomplicated   EVALUATION COMPLEXITY: Low     GOALS:   SHORT TERM GOALS:   STG Name Target Date Goal status 1/19  1 Pt will become independent  with HEP in order to demonstrate  synthesis of PT education.   06/14/2021 Working on all exercises given   2 Pt will be able to demonstrate full elbow PROM in order to demonstrate functional improvement in UE function for self-care and house hold duties.   06/14/2021 Progressing towards full motion ongoing   3 Pt will be able to demonstrate full elbow AROM in order to demonstrate functional improvement in UE function for self-care and house hold duties.   06/14/2021 Started today  Ongoing   4 Pt will score at least 9 pt increase on FOTO to demonstrate functional improvement in MCII and pt perceived function.    06/14/2021 Will assess soon  Ongoig     LONG TERM GOALS:    LTG Name Target Date Goal status  1 Pt  will become independent with final HEP in order to demonstrate synthesis of PT education.   07/26/2021 INITIAL  2 Pt will be able to carry/hold >10 lbs in order to demonstrate functional improvement in R UE strength for return to PLOF and exercise.  07/26/2021 INITIAL  3 Pt will score >/= 60 on FOTO to demonstrate improvement in perceived R elbow function.   07/26/2021 INITIAL  4 Pt will be able to demonstrate ability to simulate work related tasks without limitation in order to demonstrate functional improvement in UE/LE function for self-care and house hold duties.   07/26/2021 INITIAL    PLAN: PT FREQUENCY: 1-2x/week   PT DURATION: 12 weeks    PLANNED INTERVENTIONS: Therapeutic exercises, Therapeutic activity, Neuro Muscular re-education, Patient/Family education, Joint mobilization, Aquatic Therapy, Dry Needling, Electrical stimulation, Spinal mobilization, Cryotherapy, Moist heat, Compression bandaging, scar mobilization, Taping, Vasopneumatic device, Traction, Ultrasound, and Manual therapy   PLAN FOR NEXT SESSION: review HEP, STM/joint mobs, light resistance progression at shoulder   Daleen Bo PT DPT  06/26/2021, 5:17 PM

## 2021-06-28 ENCOUNTER — Encounter (HOSPITAL_BASED_OUTPATIENT_CLINIC_OR_DEPARTMENT_OTHER): Payer: BC Managed Care – PPO | Admitting: Physical Therapy

## 2021-06-29 ENCOUNTER — Telehealth: Payer: Self-pay | Admitting: General Practice

## 2021-06-29 NOTE — Telephone Encounter (Signed)
Pt spouse would like to know if pt can establish as a new pt.   Pt's wife: MRN: [id number]  Please advise.

## 2021-07-10 ENCOUNTER — Other Ambulatory Visit: Payer: Self-pay

## 2021-07-10 ENCOUNTER — Ambulatory Visit (HOSPITAL_BASED_OUTPATIENT_CLINIC_OR_DEPARTMENT_OTHER): Payer: BC Managed Care – PPO | Attending: Orthopaedic Surgery | Admitting: Physical Therapy

## 2021-07-10 ENCOUNTER — Encounter (HOSPITAL_BASED_OUTPATIENT_CLINIC_OR_DEPARTMENT_OTHER): Payer: Self-pay | Admitting: Physical Therapy

## 2021-07-10 DIAGNOSIS — M25621 Stiffness of right elbow, not elsewhere classified: Secondary | ICD-10-CM | POA: Diagnosis not present

## 2021-07-10 DIAGNOSIS — M6281 Muscle weakness (generalized): Secondary | ICD-10-CM | POA: Insufficient documentation

## 2021-07-10 DIAGNOSIS — M25521 Pain in right elbow: Secondary | ICD-10-CM | POA: Insufficient documentation

## 2021-07-10 NOTE — Therapy (Signed)
?OUTPATIENT PHYSICAL THERAPY TREATMENT NOTE ? ? ?Patient Name: Ryan Middleton ?MRN: [id number] ?DOB:[birth year], 56 y.o., male ?Today's Date: 07/10/2021 ? ?PCP: [facility] ? ? PT End of Session - 07/10/21 1723   ? ? Visit Number 10   ? Number of Visits 22   ? Date for PT Re-Evaluation 08/01/21   ? Authorization Type BCBS   ? PT Start Time 1505   ? PT Stop Time 6979   ? PT Time Calculation (min) 25 min   ? Activity Tolerance Patient tolerated treatment well   ? Behavior During Therapy [facility] for tasks assessed/performed   ? ?  ?  ? ?  ? ? ? ? ? ? ? ? ?Past Medical History:  ?Diagnosis Date  ? Hypertension   ? Post traumatic stress disorder   ? Sleep apnea   ? Stroke [facility])   ? TIA 2021  ? ?Past Surgical History:  ?Procedure Laterality Date  ? APPENDECTOMY    ? DISTAL BICEPS TENDON REPAIR Right 04/20/2021  ? Procedure: RIGHT DISTAL BICEPS REPAIR;  Surgeon: [name], MD;  Location: [place];  Service: Orthopedics;  Laterality: Right;  ? FOOT SURGERY Bilateral   ? plantar fascitis  ? ?Patient Active Problem List  ? Diagnosis Date Noted  ? Biceps tendon tear   ? Pain in right shoulder 03/01/2021  ? Pain in right elbow 03/01/2021  ? TIA (transient ischemic attack) 07/25/2020  ? ?PCP: [facility] ?  ?REFERRING PROVIDER: [name], MD ?  ?REFERRING DIAG: Y80.165V (ICD-10-CM) - Biceps tendon tear  ?  ?THERAPY DIAG:  ?Pain in right elbow ?  ?Stiffness of right elbow, not elsewhere classified ?  ?Muscle weakness (generalized) ?  ?  ?ONSET DATE: 04/20/2021 ?  ?  ?SUBJECTIVE:                                                                                                                                                                                     ?  ?SUBJECTIVE STATEMENT: ? ?Pt feels is he is 97%. He is just missing full strength. He feels that he is missing it while turning wrenches.  ? ?PERTINENT HISTORY: ?TIA, bilateral shoulder subluxation history* ?  ?PAIN:   ?Are you having pain? No ?VAS scale: 0/10 ?  ?  ?PRECAUTIONS: Other: R elbow ?  ?WEIGHT BEARING RESTRICTIONS Yes NWB for first 2 weeks ?  ?He may progress to full extension over the course of the next 2 weeks (per last MD note) ?  ?FALLS:  ?Has patient fallen in last 6 months? No Number of falls: 0 ?  ?LIVING ENVIRONMENT: ?Lives with: lives  with their family ?Lives in: House/apartment ?  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  ?PLOF: Independent ?  ?PATIENT GOALS : Pt states he wants to be able to get back to work.  ?  ?OBJECTIVE:  ? ?Wound- healing with scab partially in place. Proximal scab present, distal scab has been removed ?  ?Full elbow ROM achieved 0 to 120 ? ?T[place]eq Biceps Curl: measured in kg ? ?L 38.1,  33.1, 43.6; 38.27 AVG ? ?R 28.5, 26.2 40.3; 31.67 AVG ?  ? ?R arm pull 28.6 kg ? ?L arm pull 28.7 kg ? ? ?PATIENT SURVEYS:  ?FOTO 37 ?D/C 60 ?MCII 9pts ?  ? ?FOTO 9th visit 82 pts ? ? ?TODAY'S TREATMENT:  ? ?3/7 ? ?Hammer and full supinated curl 10lbs  ?Supination 10lbs 3x10 ?Row black TB band x20  ?Bent over row black TB ? ? ? ?PATIENT EDUCATION: ?Education details: exercise progression, DOMS expectations, envelope of function, HEP, POC ?  ?Person educated: Patient ?Education method: Explanation, Demonstration, Tactile cues, Verbal cues, and Handouts ?Education comprehension: verbalized understanding, returned demonstration, verbal cues required, and tactile cues required ?  ?  ?HOME EXERCISE PROGRAM: ?Access Code: [email or web address] ?URL: [email or web address] ?Date: 05/03/2021 ?Prepared by: Daleen Bo ?  ?ASSESSMENT: ?  ?CLINICAL IMPRESSION: ?Pt able to demonstrate 83% strength of R biceps compared to L. Pt has met all PT goals other than full simulating work related tasks without deficits. Pt has been given final HEP updated and cleared to begin self progression of exercise/strength through conservative, progressive overloading. Pt's FOTO score has also exceeded D/C expectations. Pt to see MD  tomorrow for further clearance.  ? ?Objective impairments include decreased ROM, decreased strength, hypomobility, increased edema, increased fascial restrictions, increased muscle spasms, impaired flexibility, impaired UE functional use, improper body mechanics, postural dysfunction, and pain. These impairments are limiting patient from cleaning, community activity, driving, meal prep, occupation, laundry, yard work, shopping, and exercise and personal care . Personal factors including Age, Fitness, and Profession are also affecting patient's functional outcome. Patient will benefit from skilled PT to address above impairments and improve overall function. ?  ?REHAB POTENTIAL: Good ?  ?CLINICAL DECISION MAKING: Stable/uncomplicated ?  ?EVALUATION COMPLEXITY: Low ?  ?  ?GOALS: ?  ?SHORT TERM GOALS: ?  ?STG Name Target Date Goal status ?1/19  ?1 Pt will become independent with HEP in order to demonstrate synthesis of PT education. ?  06/14/2021 MET  ?2 Pt will be able to demonstrate full elbow PROM in order to demonstrate functional improvement in UE function for self-care and house hold duties. ?  06/14/2021 MET  ?3 Pt will be able to demonstrate full elbow AROM in order to demonstrate functional improvement in UE function for self-care and house hold duties. ?  06/14/2021 MET  ?4 Pt will score at least 9 pt increase on FOTO to demonstrate functional improvement in MCII and pt perceived function.  ?  06/14/2021 MET  ?  ?LONG TERM GOALS:  ?  ?LTG Name Target Date Goal status  ?1 Pt  will become independent with final HEP in order to demonstrate synthesis of PT education. ?  07/26/2021 MET  ?2 Pt will be able to carry/hold >10 lbs in order to demonstrate functional improvement in R UE strength for return to PLOF and exercise.  07/26/2021 MET  ?3 Pt will score >/= 60 on FOTO to demonstrate improvement in perceived R elbow function. ?  07/26/2021 MET  ?4 Pt will be able to demonstrate ability to simulate  work related tasks without  limitation in order to demonstrate functional improvement in UE/LE function for self-care and house hold duties. ?  07/26/2021 Partially Met  ?  ?PLAN: ?PT FREQUENCY: 1-2x/week ?  ?PT DURATION: 12 weeks  ?  ?PLANNED INTERVENTIONS: Therapeutic exercises, Therapeutic activity, Neuro Muscular re-education, Patient/Family education, Joint mobilization, Aquatic Therapy, Dry Needling, Electrical stimulation, Spinal mobilization, Cryotherapy, Moist heat, Compression bandaging, scar mobilization, Taping, Vasopneumatic device, Traction, Ultrasound, and Manual therapy ? ?Daleen Bo PT DPT  ?07/10/2021, 5:25 PM ? ?   ?

## 2021-07-11 ENCOUNTER — Ambulatory Visit (INDEPENDENT_AMBULATORY_CARE_PROVIDER_SITE_OTHER): Payer: BC Managed Care – PPO | Admitting: Orthopaedic Surgery

## 2021-07-11 DIAGNOSIS — S46219A Strain of muscle, fascia and tendon of other parts of biceps, unspecified arm, initial encounter: Secondary | ICD-10-CM

## 2021-07-11 DIAGNOSIS — S46212A Strain of muscle, fascia and tendon of other parts of biceps, left arm, initial encounter: Secondary | ICD-10-CM

## 2021-07-11 NOTE — Progress Notes (Signed)
? ?                            ? ? ?Post Operative Evaluation ?  ? ?Procedure/Date of Surgery: Status post right distal biceps repair on 04/20/2021 ? ?Interval History:  ? ?07/11/2021: Presents today for follow-up.  Overall he has no pain in the distal biceps.  He is working on strengthening and is achieved approximately 85% strength compared to the contralateral side.  His dorsal wound on his forearm continues to heal and scab over. ? ? ?PMH/PSH/Family History/Social History/Meds/Allergies:   ? ?Past Medical History:  ?Diagnosis Date  ? Hypertension   ? Post traumatic stress disorder   ? Sleep apnea   ? Stroke [facility])   ? TIA 2021  ? ?Past Surgical History:  ?Procedure Laterality Date  ? APPENDECTOMY    ? DISTAL BICEPS TENDON REPAIR Right 04/20/2021  ? Procedure: RIGHT DISTAL BICEPS REPAIR;  Surgeon: [name], MD;  Location: [place];  Service: Orthopedics;  Laterality: Right;  ? FOOT SURGERY Bilateral   ? plantar fascitis  ? ?Social History  ? ?Socioeconomic History  ? Marital status: Married  ?  Spouse name: Not on file  ? Number of children: Not on file  ? Years of education: Not on file  ? Highest education level: Not on file  ?Occupational History  ? Not on file  ?Tobacco Use  ? Smoking status: Every Day  ?  Packs/day: 0.50  ?  Years: 30.00  ?  Pack years: 15.00  ?  Types: Cigarettes  ? Smokeless tobacco: Never  ?Vaping Use  ? Vaping Use: Never used  ?Substance and Sexual Activity  ? Alcohol use: Not Currently  ?  Comment: occ  ? Drug use: No  ? Sexual activity: Not on file  ?Other Topics Concern  ? Not on file  ?Social History Narrative  ? Not on file  ? ?Social Determinants of Health  ? ?Financial Resource Strain: Not on file  ?Food Insecurity: Not on file  ?Transportation Needs: Not on file  ?Physical Activity: Not on file  ?Stress: Not on file  ?Social Connections: Not on file  ? ?No family history on file. ?Allergies  ?Allergen Reactions  ? Hydrocodone Nausea And Vomiting  ? Oxycodone Nausea Only  ?  Simvastatin Rash  ? ?Current Outpatient Medications  ?Medication Sig Dispense Refill  ? aspirin EC 325 MG tablet Take 1 tablet (325 mg total) by mouth daily. 30 tablet 0  ? atorvastatin (LIPITOR) 40 MG tablet Take 1 tablet (40 mg total) by mouth daily. 90 tablet 3  ? Clobetasol Prop Emollient Base 0.05 % emollient cream Apply 1 application topically at bedtime. (Patient not taking: Reported on 04/17/2021) 15 g 5  ? propranolol ER (INDERAL LA) 80 MG 24 hr capsule Take 1 capsule (80 mg total) by mouth daily. 90 capsule 3  ? QUEtiapine (SEROQUEL) 100 MG tablet Take 100 mg by mouth at bedtime.    ? Rimegepant Sulfate (NURTEC) 75 MG TBDP Take 75 mg by mouth as needed (For acute migraine relief). (Patient not taking: Reported on 04/17/2021) 4 tablet 0  ? ?No current facility-administered medications for this visit.  ? ?No results found. ? ?Review of Systems:   ?A ROS was performed including pertinent positives and negatives as documented in the HPI. ? ? ?Musculoskeletal Exam:   ? ?There were no vitals taken for this visit. ? ?Incision is well-healing and well-appearing.  He is able to fire EPL as well as wrist extensors.  His dorsal forearm wound has now scabbed over completely and continues to decrease in size.  Range of motion is from 0 degrees to 130 without difficulty.  Pro supination is full without pain. ?Imaging:   ? ?None ? ?I personally reviewed and interpreted the radiographs. ? ? ?Assessment:   ?56 year old male 2.5 months status post right distal biceps repair overall doing very well from a distal biceps perspective.  He continues to strengthen and improve.  I would like him to continue to work on a home strengthening program.  I will see him back in 2 months and at that time we will plan to refer him for final strength assessment with physical therapy as well as for a final wound check ?Plan :   ? ?-Return to clinic in 8 weeks ? ? ? ? ?I personally saw and evaluated the patient, and participated in the  management and treatment plan. ? ?[name], MD ?Attending Physician, Orthopedic Surgery ? ?This document was dictated using Systems analyst. A reasonable attempt at proof reading has been made to minimize errors. ?

## 2021-07-23 ENCOUNTER — Telehealth: Payer: Self-pay | Admitting: Adult Health

## 2021-07-23 ENCOUNTER — Other Ambulatory Visit: Payer: Self-pay

## 2021-07-23 MED ORDER — PROPRANOLOL HCL ER 80 MG PO CP24: 80.0000 mg | ORAL_CAPSULE | Freq: Every day | ORAL | 3 refills | Status: DC

## 2021-07-23 MED ORDER — ATORVASTATIN CALCIUM 40 MG PO TABS: 40.0000 mg | ORAL_TABLET | Freq: Every day | ORAL | 3 refills | Status: DC

## 2021-07-23 NOTE — Telephone Encounter (Signed)
Contacted pt, informed him a yr supply was given 08/23/20 for Both Rx. Pt states he has maybe 15 tablets or so left and will not make it until the 20th of April. ? ?Contacted pharmacy to verify refills, pharmacist states he did have 1 refill left but it was closed out and doesn't have a reason as to why. Will send in new Rx .  ?

## 2021-07-23 NOTE — Telephone Encounter (Signed)
At 3:02 pt left vm asking for a refill on his propranolol ER (INDERAL LA) 80 MG 24 hr capsule & atorvastatin (LIPITOR) 40 MG tablet to [id number]  ?

## 2021-08-14 ENCOUNTER — Ambulatory Visit (INDEPENDENT_AMBULATORY_CARE_PROVIDER_SITE_OTHER): Payer: BC Managed Care – PPO | Admitting: Family Medicine

## 2021-08-14 ENCOUNTER — Encounter: Payer: Self-pay | Admitting: Family Medicine

## 2021-08-14 VITALS — BP 118/80 | HR 76 | Temp 98.1°F | Ht 67.0 in | Wt 225.4 lb

## 2021-08-14 DIAGNOSIS — Z125 Encounter for screening for malignant neoplasm of prostate: Secondary | ICD-10-CM

## 2021-08-14 DIAGNOSIS — Z23 Encounter for immunization: Secondary | ICD-10-CM | POA: Diagnosis not present

## 2021-08-14 DIAGNOSIS — I10 Essential (primary) hypertension: Secondary | ICD-10-CM | POA: Diagnosis not present

## 2021-08-14 DIAGNOSIS — Z1159 Encounter for screening for other viral diseases: Secondary | ICD-10-CM | POA: Diagnosis not present

## 2021-08-14 DIAGNOSIS — K635 Polyp of colon: Secondary | ICD-10-CM | POA: Diagnosis not present

## 2021-08-14 DIAGNOSIS — Z0000 Encounter for general adult medical examination without abnormal findings: Secondary | ICD-10-CM | POA: Diagnosis not present

## 2021-08-14 NOTE — Progress Notes (Signed)
Chief Complaint  ?Patient presents with  ? New Patient (Initial Visit)  ?  Needs a physical today ?  ? ? ?Well Male ?Ryan Middleton is here for a complete physical.   ?His last physical was >1 year ago.  ?Current diet: in general, a "healthy" diet.  ?Current exercise: Active at work, walking ?Weight trend: stable ?Fatigue out of ordinary? No. ?Seat belt? Yes.   ?Advanced directive? No ? ?Health maintenance ?Shingrix- Due for 2nd ?Colonoscopy- Due ?Tetanus- Yes ?HIV- Yes ?Hep C- No ?  ?Past Medical History:  ?Diagnosis Date  ? Hypertension   ? Post traumatic stress disorder   ? Sleep apnea   ? Stroke [facility])   ? TIA 2021  ?  ?Past Surgical History:  ?Procedure Laterality Date  ? APPENDECTOMY    ? DISTAL BICEPS TENDON REPAIR Right 04/20/2021  ? Procedure: RIGHT DISTAL BICEPS REPAIR;  Surgeon: [name], MD;  Location: [place];  Service: Orthopedics;  Laterality: Right;  ? FOOT SURGERY Bilateral   ? plantar fascitis  ? ? ?Medications  ?Current Outpatient Medications on File Prior to Visit  ?Medication Sig Dispense Refill  ? aspirin EC 325 MG tablet Take 1 tablet (325 mg total) by mouth daily. 30 tablet 0  ? atorvastatin (LIPITOR) 40 MG tablet Take 1 tablet (40 mg total) by mouth daily. 90 tablet 3  ? propranolol ER (INDERAL LA) 80 MG 24 hr capsule Take 1 capsule (80 mg total) by mouth daily. 90 capsule 3  ? QUEtiapine (SEROQUEL) 100 MG tablet Take 100 mg by mouth at bedtime.    ? ? ?Allergies ?Allergies  ?Allergen Reactions  ? Hydrocodone Nausea And Vomiting  ? Oxycodone Nausea Only  ? Simvastatin Rash  ? ? ?Family History ?Family History  ?Adopted: Yes  ? ? ?Review of Systems: ?Constitutional:  no fevers ?Eye:  no recent significant change in vision ?Ear/Nose/Mouth/Throat:  Ears:  no hearing loss ?Nose/Mouth/Throat:  no complaints of nasal congestion, no sore throat ?Cardiovascular:  no chest pain ?Respiratory:  no shortness of breath ?Gastrointestinal:  no change in bowel habits ?GU:  Male: negative for dysuria,  frequency ?Musculoskeletal/Extremities:  no new joint pain ?Integumentary (Skin/Breast):  no abnormal skin lesions reported ?Neurologic:  no headaches ?Endocrine: No unexpected weight changes ?Hematologic/Lymphatic:  no abnormal bleeding ? ?Exam ?BP 118/80   Pulse 76   Temp 98.1 ?F (36.7 ?C) (Oral)   Ht '5\' 7"'$  (1.702 m)   Wt 225 lb 6 oz (102.2 kg)   SpO2 98%   BMI 35.30 kg/m?  ?General:  well developed, well nourished, in no apparent distress ?Skin:  no significant moles, warts, or growths ?Head:  no masses, lesions, or tenderness ?Eyes:  pupils equal and round, sclera anicteric without injection ?Ears:  canals without lesions, TMs shiny without retraction, no obvious effusion, no erythema ?Nose:  nares patent, septum midline, mucosa normal ?Throat/Pharynx:  lips and gingiva without lesion; tongue and uvula midline; non-inflamed pharynx; no exudates or postnasal drainage ?Neck: neck supple without adenopathy, thyromegaly, or masses ?Cardiac: RRR, no bruits, no LE edema ?Lungs:  clear to auscultation, breath sounds equal bilaterally, no respiratory distress ?Abdomen: BS+, soft, non-tender, non-distended, no masses or organomegaly noted ?Rectal: Deferred ?Musculoskeletal:  symmetrical muscle groups noted without atrophy or deformity ?Neuro:  gait normal; deep tendon reflexes normal and symmetric ?Psych: well oriented with normal range of affect and appropriate judgment/insight ? ?Assessment and Plan ? ?Well adult exam - Plan: Lipid panel, Comprehensive metabolic panel, CBC ? ?Screening for  prostate cancer - Plan: PSA ? ?Polyp of colon, unspecified part of colon, unspecified type - Plan: Ambulatory referral to Gastroenterology ? ?Encounter for hepatitis C screening test for low risk patient - Plan: Hepatitis C antibody ? ?Need for shingles vaccine - Plan: Varicella-zoster vaccine IM (Shingrix)  ? ?Well 56 y.o. male. ?Counseled on diet and exercise. ?Counseled on risks and benefits of prostate cancer screening with  PSA. The patient agrees to undergo testing.  He had a biopsy in the past, may need to follow with the urologist routinely.  Would set up with [facility] if that is the case. ?Second and final Shingrix today. ?He has a history of colon polyps and thinks he had been suggested 3-year follow-up.  Nobody has reached out to him so we will set him up with the [facility] gastroenterology team. ?Advanced directive form requested.  ?Immunizations, labs, and further orders as above. ?Follow up in 6 mo. ?The patient voiced understanding and agreement to the plan. ? ?Shelda Pal, DO ?08/14/21 ?4:33 PM ? ?

## 2021-08-14 NOTE — Patient Instructions (Signed)
Give us 2-3 business days to get the results of your labs back.   Keep the diet clean and stay active.  Please get me a copy of your advanced directive form at your convenience.   If you do not hear anything about your referral in the next 1-2 weeks, call our office and ask for an update.  Let us know if you need anything. 

## 2021-08-15 ENCOUNTER — Other Ambulatory Visit: Payer: Self-pay | Admitting: Family Medicine

## 2021-08-15 DIAGNOSIS — E785 Hyperlipidemia, unspecified: Secondary | ICD-10-CM

## 2021-08-15 LAB — COMPREHENSIVE METABOLIC PANEL
ALT: 39 U/L (ref 0–53)
AST: 22 U/L (ref 0–37)
Albumin: 4.8 g/dL (ref 3.5–5.2)
Alkaline Phosphatase: 105 U/L (ref 39–117)
BUN: 11 mg/dL (ref 6–23)
CO2: 27 mEq/L (ref 19–32)
Calcium: 9.5 mg/dL (ref 8.4–10.5)
Chloride: 103 mEq/L (ref 96–112)
Creatinine, Ser: 1.05 mg/dL (ref 0.40–1.50)
GFR: 80.39 mL/min (ref >60.00)
Glucose, Bld: 99 mg/dL (ref 70–99)
Potassium: 4.1 mEq/L (ref 3.5–5.1)
Sodium: 141 mEq/L (ref 135–145)
Total Bilirubin: 1.2 mg/dL (ref 0.2–1.2)
Total Protein: 7.1 g/dL (ref 6.0–8.3)

## 2021-08-15 LAB — CBC
HCT: 47.7 % (ref 39.0–52.0)
Hemoglobin: 16.7 g/dL (ref 13.0–17.0)
MCHC: 35.1 g/dL (ref 30.0–36.0)
MCV: 96.1 fl (ref 78.0–100.0)
Platelets: 232 10*3/uL (ref 150.0–400.0)
RBC: 4.96 Mil/uL (ref 4.22–5.81)
RDW: 13.9 % (ref 11.5–15.5)
WBC: 9.9 10*3/uL (ref 4.0–10.5)

## 2021-08-15 LAB — LIPID PANEL
Cholesterol: 132 mg/dL (ref 0–200)
HDL: 28.7 mg/dL — ABNORMAL LOW (ref >39.00)
LDL Cholesterol: 66 mg/dL (ref 0–99)
NonHDL: 103.38
Total CHOL/HDL Ratio: 5
Triglycerides: 187 mg/dL — ABNORMAL HIGH (ref 0.0–149.0)
VLDL: 37.4 mg/dL (ref 0.0–40.0)

## 2021-08-15 LAB — PSA: PSA: 2.76 ng/mL (ref 0.10–4.00)

## 2021-08-15 LAB — HEPATITIS C ANTIBODY
Hepatitis C Ab: NONREACTIVE
SIGNAL TO CUT-OFF: 0.07 (ref <1.00)

## 2021-08-23 ENCOUNTER — Encounter: Payer: Self-pay | Admitting: Adult Health

## 2021-08-23 ENCOUNTER — Ambulatory Visit (INDEPENDENT_AMBULATORY_CARE_PROVIDER_SITE_OTHER): Payer: BC Managed Care – PPO | Admitting: Adult Health

## 2021-08-23 VITALS — BP 132/94 | HR 64 | Ht 67.0 in | Wt 224.0 lb

## 2021-08-23 DIAGNOSIS — G43109 Migraine with aura, not intractable, without status migrainosus: Secondary | ICD-10-CM | POA: Diagnosis not present

## 2021-08-23 DIAGNOSIS — G459 Transient cerebral ischemic attack, unspecified: Secondary | ICD-10-CM | POA: Diagnosis not present

## 2021-08-23 NOTE — Patient Instructions (Addendum)
Continue propranolol '80mg'$  daily for migraine prevention  ? ?Continue aspirin and atorvastatin for secondary stroke prevention  ? ? ?Follow up as needed at this time  ?
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
## 2021-09-13 ENCOUNTER — Ambulatory Visit (INDEPENDENT_AMBULATORY_CARE_PROVIDER_SITE_OTHER): Payer: BC Managed Care – PPO | Admitting: Orthopaedic Surgery

## 2021-09-13 DIAGNOSIS — M67911 Unspecified disorder of synovium and tendon, right shoulder: Secondary | ICD-10-CM

## 2021-09-13 NOTE — Progress Notes (Signed)
? ?                            ? ? ?Post Operative Evaluation ?  ? ?Procedure/Date of Surgery: Status post right distal biceps repair on 04/20/2021 ? ?Interval History:  ? ? ?09/13/2021: Presents today for follow-up.  Overall he has no pain in the right elbow.  His wound is well-healing at this time.  He does endorse some right shoulder pain is consistent with rotator cuff tendinitis.  He has pain with overhead activity particularly at his job. ? ? ?PMH/PSH/Family History/Social History/Meds/Allergies:   ? ?Past Medical History:  ?Diagnosis Date  ? Hypertension   ? Post traumatic stress disorder   ? Sleep apnea   ? Stroke [facility])   ? TIA 2021  ? ?Past Surgical History:  ?Procedure Laterality Date  ? APPENDECTOMY    ? DISTAL BICEPS TENDON REPAIR Right 04/20/2021  ? Procedure: RIGHT DISTAL BICEPS REPAIR;  Surgeon: [name], MD;  Location: [place];  Service: Orthopedics;  Laterality: Right;  ? FOOT SURGERY Bilateral   ? plantar fascitis  ? ?Social History  ? ?Socioeconomic History  ? Marital status: Married  ?  Spouse name: Not on file  ? Number of children: Not on file  ? Years of education: Not on file  ? Highest education level: Not on file  ?Occupational History  ? Not on file  ?Tobacco Use  ? Smoking status: Every Day  ?  Packs/day: 0.50  ?  Years: 30.00  ?  Pack years: 15.00  ?  Types: Cigarettes  ? Smokeless tobacco: Never  ?Vaping Use  ? Vaping Use: Never used  ?Substance and Sexual Activity  ? Alcohol use: Not Currently  ?  Comment: occ  ? Drug use: No  ? Sexual activity: Not on file  ?Other Topics Concern  ? Not on file  ?Social History Narrative  ? Not on file  ? ?Social Determinants of Health  ? ?Financial Resource Strain: Not on file  ?Food Insecurity: Not on file  ?Transportation Needs: Not on file  ?Physical Activity: Not on file  ?Stress: Not on file  ?Social Connections: Not on file  ? ?Family History  ?Adopted: Yes  ? ?Allergies  ?Allergen Reactions  ? Hydrocodone Nausea And Vomiting  ? Oxycodone  Nausea Only  ? Simvastatin Rash  ? ?Current Outpatient Medications  ?Medication Sig Dispense Refill  ? aspirin EC 325 MG tablet Take 1 tablet (325 mg total) by mouth daily. 30 tablet 0  ? atorvastatin (LIPITOR) 40 MG tablet Take 1 tablet (40 mg total) by mouth daily. 90 tablet 3  ? propranolol ER (INDERAL LA) 80 MG 24 hr capsule Take 1 capsule (80 mg total) by mouth daily. 90 capsule 3  ? QUEtiapine (SEROQUEL) 100 MG tablet Take 100 mg by mouth at bedtime.    ? ?No current facility-administered medications for this visit.  ? ?No results found. ? ?Review of Systems:   ?A ROS was performed including pertinent positives and negatives as documented in the HPI. ? ? ?Musculoskeletal Exam:   ? ?There were no vitals taken for this visit. ? ?Incision is well-healed and well-appearing.  He is able to fire EPL as well as wrist extensors.  His dorsal forearm wound has healed.  Range of motion is from 0 degrees to 130 without difficulty.  Pro supination is full without pain. ? ?With regard to the right shoulder he does have  positive Neer impingement on the right.  Full forward elevation of 170 degrees bilaterally.  External rotation is to 60 degrees at the side.  Negative belly press bilaterally ?Imaging:   ? ?None ? ?I personally reviewed and interpreted the radiographs. ? ? ?Assessment:   ?56 year old male 5 months status post right distal biceps repair overall doing very well from a distal biceps perspective.  She does have bilateral shoulder pain consistent with rotator cuff tendinitis.  At this time I will plan to refer him to physical therapy for a rotator cuff strengthening program ?Plan :   ? ?-He will send me a message in 6 weeks if no relief from physical therapy at that time we will consider subacromial injection ? ? ? ? ?I personally saw and evaluated the patient, and participated in the management and treatment plan. ? ?[name], MD ?Attending Physician, Orthopedic Surgery ? ?This document was dictated using  Systems analyst. A reasonable attempt at proof reading has been made to minimize errors. ?

## 2021-09-21 ENCOUNTER — Other Ambulatory Visit (INDEPENDENT_AMBULATORY_CARE_PROVIDER_SITE_OTHER): Payer: BC Managed Care – PPO

## 2021-09-21 DIAGNOSIS — E785 Hyperlipidemia, unspecified: Secondary | ICD-10-CM | POA: Diagnosis not present

## 2021-09-21 NOTE — Addendum Note (Signed)
Addended by: Kelle Darting A on: 09/21/2021 03:02 PM   Modules accepted: Orders

## 2021-09-22 LAB — LIPID PANEL
Cholesterol: 125 mg/dL (ref <200)
HDL: 26 mg/dL — ABNORMAL LOW (ref >=40)
LDL Cholesterol (Calc): 73 mg/dL (calc)
Non-HDL Cholesterol (Calc): 99 mg/dL (calc) (ref <130)
Total CHOL/HDL Ratio: 4.8 (calc) (ref <5.0)
Triglycerides: 181 mg/dL — ABNORMAL HIGH (ref <150)

## 2021-10-04 ENCOUNTER — Other Ambulatory Visit: Payer: Self-pay | Admitting: Family Medicine

## 2021-10-04 ENCOUNTER — Encounter: Payer: Self-pay | Admitting: Family Medicine

## 2021-10-04 MED ORDER — ATORVASTATIN CALCIUM 80 MG PO TABS: 80.0000 mg | ORAL_TABLET | Freq: Every day | ORAL | 3 refills | Status: DC

## 2021-10-05 ENCOUNTER — Telehealth: Payer: Self-pay | Admitting: Family Medicine

## 2021-10-05 NOTE — Telephone Encounter (Signed)
Caller: Jacalyn Lefevre  Pt stated per wendling he needs labs for cholesterol to have rx prescribed. No orders

## 2021-10-05 NOTE — Telephone Encounter (Signed)
Pt was call and appt made

## 2021-10-16 ENCOUNTER — Encounter: Payer: Self-pay | Admitting: Family Medicine

## 2021-10-16 ENCOUNTER — Ambulatory Visit (INDEPENDENT_AMBULATORY_CARE_PROVIDER_SITE_OTHER): Payer: BC Managed Care – PPO | Admitting: Family Medicine

## 2021-10-16 VITALS — BP 120/82 | HR 70 | Temp 98.2°F | Ht 67.0 in | Wt 224.4 lb

## 2021-10-16 DIAGNOSIS — Z6835 Body mass index (BMI) 35.0-35.9, adult: Secondary | ICD-10-CM

## 2021-10-16 DIAGNOSIS — E782 Mixed hyperlipidemia: Secondary | ICD-10-CM

## 2021-10-16 DIAGNOSIS — Z720 Tobacco use: Secondary | ICD-10-CM

## 2021-10-16 MED ORDER — SEMAGLUTIDE-WEIGHT MANAGEMENT 1.7 MG/0.75ML ~~LOC~~ SOAJ: 1.7000 mg | SUBCUTANEOUS | 0 refills | Status: DC

## 2021-10-16 MED ORDER — BUPROPION HCL ER (SMOKING DET) 150 MG PO TB12: ORAL_TABLET | ORAL | 2 refills | Status: DC

## 2021-10-16 MED ORDER — NICOTINE 21 MG/24HR TD PT24: 21.0000 mg | MEDICATED_PATCH | Freq: Every day | TRANSDERMAL | 0 refills | Status: AC

## 2021-10-16 MED ORDER — SEMAGLUTIDE-WEIGHT MANAGEMENT 0.5 MG/0.5ML ~~LOC~~ SOAJ: 0.5000 mg | SUBCUTANEOUS | 0 refills | Status: AC

## 2021-10-16 MED ORDER — NICOTINE 7 MG/24HR TD PT24: 7.0000 mg | MEDICATED_PATCH | Freq: Every day | TRANSDERMAL | 0 refills | Status: DC

## 2021-10-16 MED ORDER — SEMAGLUTIDE-WEIGHT MANAGEMENT 2.4 MG/0.75ML ~~LOC~~ SOAJ: 2.4000 mg | SUBCUTANEOUS | 0 refills | Status: DC

## 2021-10-16 MED ORDER — SEMAGLUTIDE-WEIGHT MANAGEMENT 0.25 MG/0.5ML ~~LOC~~ SOAJ
0.2500 mg | SUBCUTANEOUS | 0 refills | Status: AC
Start: 2021-10-16 — End: 2021-11-13

## 2021-10-16 MED ORDER — SEMAGLUTIDE-WEIGHT MANAGEMENT 1 MG/0.5ML ~~LOC~~ SOAJ: 1.0000 mg | SUBCUTANEOUS | 0 refills | Status: DC

## 2021-10-16 MED ORDER — NICOTINE 14 MG/24HR TD PT24: 14.0000 mg | MEDICATED_PATCH | Freq: Every day | TRANSDERMAL | 0 refills | Status: AC

## 2021-10-16 NOTE — Progress Notes (Signed)
Chief Complaint  Patient presents with   Follow-up    Subjective: Hyperlipidemia Patient presents for Hyperlipidemia follow up. Currently taking Lipitor 80 mg/d and compliance with treatment thus far has been good. He denies myalgias. He is usually adhering to a healthy diet. Exercise: active at work, walking The patient is not known to have coexisting coronary artery disease. No Cp or SOB.   Obesity Patient has been struggling to lose weight.  Diet/exercise as above.  His wife is on Ozempic to help with her diabetes.  He is interested in starting a weekly shot to help with weight loss as well.  He has never been on anything in the past before.  Tobacco abuse Patient smokes around a pack per day and i has smoked for more than 20 years.  He has tried Chantix in the past which did not help.  He is interested in using something to quit.  He has never been on Zyban or used nicotine patches.  Past Medical History:  Diagnosis Date   Hypertension    Post traumatic stress disorder    Sleep apnea    Stroke ([place])    TIA 2021    Objective: BP 120/82   Pulse 70   Temp 98.2 F (36.8 C) (Oral)   Ht '5\' 7"'$  (1.702 m)   Wt 224 lb 6 oz (101.8 kg)   SpO2 99%   BMI 35.14 kg/m  General: Awake, appears stated age Heart: RRR, no LE edema, no bruits Lungs: CTAB, no rales, wheezes or rhonchi. No accessory muscle use Psych: Age appropriate judgment and insight, normal affect and mood  Assessment and Plan: Mixed hyperlipidemia - Plan: Lipid panel, Hepatic function panel  Class 2 severe obesity due to excess calories with serious comorbidity and body mass index (BMI) of 35.0 to 35.9 in adult [facility]) - Plan: Semaglutide-Weight Management 0.25 MG/0.5ML SOAJ, Semaglutide-Weight Management 0.5 MG/0.5ML SOAJ, Semaglutide-Weight Management 1 MG/0.5ML SOAJ, Semaglutide-Weight Management 1.7 MG/0.75ML SOAJ, Semaglutide-Weight Management 2.4 MG/0.75ML SOAJ  Tobacco abuse - Plan: buPROPion (ZYBAN) 150 MG 12  hr tablet, nicotine (NICODERM CQ - DOSED IN MG/24 HOURS) 21 mg/24hr patch, nicotine (NICODERM CQ - DOSED IN MG/24 HOURS) 14 mg/24hr patch, nicotine (NICODERM CQ - DOSED IN MG/24 HR) 7 mg/24hr patch  Chronic, unsure if controlled.  Continue Lipitor 80 mg daily.  Counseled on diet and exercise.  Check above labs, he will come back when he is fasting. Chronic, not controlled.  Start Wegovy at 4 doses of each above strength, sequentially increasing until he gets to 2.4 mg weekly. F/u in 6 weeks to recheck. Chronic, not controlled.  Start both Zyban and nicotine patches.  Since he smokes greater than 10 cigarettes daily, we will start him at the 21 mg dosage for 6 weeks of the patch and then 2 weeks of the 14 mg dosage and finally 2 weeks of the 7 mg dosage.  We will concurrently start Zyban 150 mg daily for 3 days starting 1 week prior to his quit date.  After 3 days he will start taking twice daily.  Commended him for trying to start the process of quitting smoking. The patient voiced understanding and agreement to the plan.  [place], DO 10/16/21  4:41 PM

## 2021-10-16 NOTE — Patient Instructions (Signed)
Keep the diet clean and stay active.  Give [place] 2-3 business days to get the results of your labs back.   Let me know if the medication is too expensive.   Let us know if you need anything.

## 2021-10-17 ENCOUNTER — Encounter: Payer: Self-pay | Admitting: Family Medicine

## 2021-10-17 ENCOUNTER — Telehealth: Payer: Self-pay | Admitting: Family Medicine

## 2021-10-17 NOTE — Telephone Encounter (Signed)
Faxed patient enrollment form for Obesity to [facility] to [phone]. Received fax confirmation.

## 2021-10-17 NOTE — Telephone Encounter (Signed)
Initiated PA for [facility] KEY:  [id number] Received denial Have not received documentation for reason for denial

## 2021-10-18 NOTE — Telephone Encounter (Signed)
Reason for denial: The requested service is not a covered benefit per your benefit booklet or plan documents."

## 2021-10-19 ENCOUNTER — Other Ambulatory Visit (INDEPENDENT_AMBULATORY_CARE_PROVIDER_SITE_OTHER): Payer: BC Managed Care – PPO

## 2021-10-19 DIAGNOSIS — E782 Mixed hyperlipidemia: Secondary | ICD-10-CM

## 2021-10-19 NOTE — Addendum Note (Signed)
Addended by: Kelle Darting A on: 10/19/2021 03:46 PM   Modules accepted: Orders

## 2021-10-20 LAB — LIPID PANEL
Cholesterol: 99 mg/dL (ref <200)
HDL: 28 mg/dL — ABNORMAL LOW (ref >=40)
LDL Cholesterol (Calc): 48 mg/dL (calc)
Non-HDL Cholesterol (Calc): 71 mg/dL (calc) (ref <130)
Total CHOL/HDL Ratio: 3.5 (calc) (ref <5.0)
Triglycerides: 152 mg/dL — ABNORMAL HIGH (ref <150)

## 2021-10-20 LAB — HEPATIC FUNCTION PANEL
AG Ratio: 1.9 (calc) (ref 1.0–2.5)
ALT: 28 U/L (ref 9–46)
AST: 18 U/L (ref 10–35)
Albumin: 4.5 g/dL (ref 3.6–5.1)
Alkaline phosphatase (APISO): 108 U/L (ref 35–144)
Bilirubin, Direct: 0.3 mg/dL — ABNORMAL HIGH (ref 0.0–0.2)
Globulin: 2.4 g/dL (calc) (ref 1.9–3.7)
Indirect Bilirubin: 1.1 mg/dL (calc) (ref 0.2–1.2)
Total Bilirubin: 1.4 mg/dL — ABNORMAL HIGH (ref 0.2–1.2)
Total Protein: 6.9 g/dL (ref 6.1–8.1)

## 2021-10-24 ENCOUNTER — Ambulatory Visit (HOSPITAL_BASED_OUTPATIENT_CLINIC_OR_DEPARTMENT_OTHER): Payer: BC Managed Care – PPO | Admitting: Physical Therapy

## 2021-10-26 NOTE — Telephone Encounter (Signed)
Phone call from insurance states that PA for [facility] will be needed. They will fax forms.

## 2021-11-01 MED ORDER — SEMAGLUTIDE-WEIGHT MANAGEMENT 0.25 MG/0.5ML ~~LOC~~ SOAJ
0.2500 mg | SUBCUTANEOUS | 0 refills | Status: DC
Start: 2021-11-01 — End: 2021-11-07

## 2021-11-01 MED ORDER — SEMAGLUTIDE-WEIGHT MANAGEMENT 0.25 MG/0.5ML ~~LOC~~ SOAJ: 0.2500 mg | SUBCUTANEOUS | 0 refills | Status: DC

## 2021-11-01 NOTE — Addendum Note (Signed)
Addended by: Kem Boroughs D on: 11/01/2021 04:29 PM   Modules accepted: Orders

## 2021-11-07 ENCOUNTER — Other Ambulatory Visit: Payer: Self-pay

## 2021-11-07 ENCOUNTER — Encounter: Payer: Self-pay | Admitting: Family Medicine

## 2021-11-07 MED ORDER — SEMAGLUTIDE-WEIGHT MANAGEMENT 0.25 MG/0.5ML ~~LOC~~ SOAJ: 0.2500 mg | SUBCUTANEOUS | 0 refills | Status: DC

## 2021-11-29 ENCOUNTER — Encounter: Payer: Self-pay | Admitting: Family Medicine

## 2021-11-29 ENCOUNTER — Ambulatory Visit (INDEPENDENT_AMBULATORY_CARE_PROVIDER_SITE_OTHER): Payer: BC Managed Care – PPO | Admitting: Family Medicine

## 2021-11-29 DIAGNOSIS — M5136 Other intervertebral disc degeneration, lumbar region: Secondary | ICD-10-CM | POA: Diagnosis not present

## 2021-11-29 DIAGNOSIS — Z6835 Body mass index (BMI) 35.0-35.9, adult: Secondary | ICD-10-CM

## 2021-11-29 NOTE — Patient Instructions (Addendum)
Send me a message a few days after the last shot of the box and we will send in the higher dosage if you are doing well.   Keep the diet clean and stay active.  If you do not hear anything about your referral in the next 1-2 weeks, call our office and ask for an update.  Reach out to your pain clinic to set up with someone else. If you like them, OK to cancel the appointment we are setting up today.   Let us know if you need anything.

## 2021-11-29 NOTE — Progress Notes (Signed)
Chief Complaint  Patient presents with   Follow-up    Subjective: Patient is a 56 y.o. male here for f/u obesity.  Started on semaglutide.  He is currently taking 0.25 mg weekly and reports compliance without adverse effects.  He feels his appetite decreasing.  1 time he ate past the feeling of satiety and does not feel well.  He denies any bowel changes, nausea, or vomiting.  He has lost a couple pounds.  The patient has a history of degenerative disc disease in his lumbar spine in addition to chronic low back pain.  He was following with pain management receiving relatively routine steroid injections.  These were particularly helpful.  He is not keen on the idea of surgery and would prefer to avoid that in addition to narcotics.  Past Medical History:  Diagnosis Date   Hypertension    Post traumatic stress disorder    Sleep apnea    Stroke ([place])    TIA 2021    Objective: BP 130/82   Pulse 65   Temp 97.6 F (36.4 C) (Oral)   Ht '5\' 7"'$  (1.702 m)   Wt 222 lb (100.7 kg)   SpO2 97%   BMI 34.77 kg/m  General: Awake, appears stated age Heart: RRR, no LE edema Lungs: CTAB, no rales, wheezes or rhonchi. No accessory muscle use Neuro: Antalgic gait, no cerebellar signs Psych: Age appropriate judgment and insight, normal affect and mood  Assessment and Plan: Class 2 severe obesity due to excess calories with serious comorbidity and body mass index (BMI) of 35.0 to 35.9 in adult [facility])  DDD (degenerative disc disease), lumbar - Plan: Ambulatory referral to Physical Medicine Rehab  Chronic, not stable.  We will steadily increase his semaglutide dosage up to 2.4 mg weekly as tolerated.  Counseled on diet and exercise. Refer to PM&R for their opinion and hopefully to restart injections.  He will also reach out to his current pain clinic to see if he can set up with one of his previous physicians partners and stay with them if he likes it.  Heat, ice, Tylenol. Follow-up in 6 months or as  needed. The patient voiced understanding and agreement to the plan.  [place], DO 11/29/21  4:53 PM

## 2021-12-05 ENCOUNTER — Ambulatory Visit: Payer: BLUE CROSS/BLUE SHIELD | Admitting: Dermatology

## 2021-12-11 DIAGNOSIS — M7072 Other bursitis of hip, left hip: Secondary | ICD-10-CM | POA: Diagnosis not present

## 2021-12-11 DIAGNOSIS — G894 Chronic pain syndrome: Secondary | ICD-10-CM | POA: Diagnosis not present

## 2021-12-11 DIAGNOSIS — M7062 Trochanteric bursitis, left hip: Secondary | ICD-10-CM | POA: Diagnosis not present

## 2021-12-11 DIAGNOSIS — M519 Unspecified thoracic, thoracolumbar and lumbosacral intervertebral disc disorder: Secondary | ICD-10-CM | POA: Diagnosis not present

## 2021-12-12 ENCOUNTER — Encounter: Payer: Self-pay | Admitting: Gastroenterology

## 2021-12-12 ENCOUNTER — Ambulatory Visit: Payer: BC Managed Care – PPO | Admitting: Dermatology

## 2021-12-12 ENCOUNTER — Telehealth: Payer: Self-pay | Admitting: Gastroenterology

## 2021-12-12 NOTE — Telephone Encounter (Signed)
Good morning Dr. Rush Landmark,  (AM Supervisor 08/14/2021)  The following PT was referred to [place] for Polyp of colon and is requesting transfer of care. We do have records attached. Please review and advise of scheduling. Thank you.

## 2021-12-12 NOTE — Telephone Encounter (Signed)
I have reviewed the paperwork/referral packet. 2019 Colonoscopy in [place], [place] 1, 12 mm TC polyp - hot snare 1, 10 mm TC polyp - hot snare 3, 2 mm TC polyps - forceps Hemorrhoids  Pathology consistent with Tubular Adenomas.  These results will be scanned into the chart.  Patient will benefit from updated colon polyps surveillance. This can be scheduled as a direct procedure with me. Thanks. GM

## 2021-12-13 NOTE — Telephone Encounter (Signed)
Called PT to schedule, left VM

## 2021-12-15 ENCOUNTER — Encounter: Payer: Self-pay | Admitting: Family Medicine

## 2021-12-17 ENCOUNTER — Other Ambulatory Visit: Payer: Self-pay | Admitting: Family Medicine

## 2021-12-17 ENCOUNTER — Telehealth: Payer: Self-pay | Admitting: Family Medicine

## 2021-12-17 MED ORDER — SEMAGLUTIDE-WEIGHT MANAGEMENT 1 MG/0.5ML ~~LOC~~ SOAJ: 1.0000 mg | SUBCUTANEOUS | 0 refills | Status: DC

## 2021-12-17 MED ORDER — SEMAGLUTIDE-WEIGHT MANAGEMENT 1 MG/0.5ML ~~LOC~~ SOAJ: 1.0000 mg | SUBCUTANEOUS | 0 refills | Status: AC

## 2021-12-17 NOTE — Telephone Encounter (Signed)
Initiated PA for Ozempic KEY:  [id number] Waiting response.

## 2021-12-17 NOTE — Telephone Encounter (Signed)
Disregard PA Patient pays out of pocket per PCP

## 2021-12-18 NOTE — Telephone Encounter (Signed)
PT scheduled colonoscopy for 01/29/2022 at 9 AM and PV for 01/01/2022 at 4:30 PM

## 2021-12-19 ENCOUNTER — Telehealth: Payer: Self-pay | Admitting: Family Medicine

## 2021-12-19 MED ORDER — SEMAGLUTIDE-WEIGHT MANAGEMENT 2.4 MG/0.75ML ~~LOC~~ SOAJ
2.4000 mg | SUBCUTANEOUS | 1 refills | Status: DC
Start: 2021-12-19 — End: 2022-03-18

## 2021-12-19 NOTE — Telephone Encounter (Signed)
Reina from [facility] called to advise that they do not have the Semaglutide-Weight Management 1 MG/0.5ML SOAJ [[id number]]  available.   They do have Semaglutide-Weight Management 2.'5MG'$ /1 ML and B6 '10mg'$ /ml  If provider is okay with this please send in new rx. [phone]; fax: [phone]

## 2021-12-19 NOTE — Telephone Encounter (Signed)
Called and spoke to the patient and he is ok to send in the 2.5  He wants to know if anyway to split the dose

## 2021-12-19 NOTE — Telephone Encounter (Signed)
Faxed to [facility] Called informed the patient of PCP instructions.

## 2021-12-27 ENCOUNTER — Telehealth: Payer: Self-pay | Admitting: Family Medicine

## 2021-12-27 NOTE — Telephone Encounter (Signed)
Pharmacy Rep called stating that they are unable to fill the Rx for the 2.4 semaglutide due to not having a stock of that medication. Rep stated that they do have other medications as a substitute and would like a call back to go over whether any of those could be used as a substitute.

## 2021-12-27 NOTE — Telephone Encounter (Signed)
Pharmacy rep called stating the dosing for males should be     Semaglutide 2.5 with peroxide 10 mg/ml Quanity needs to be 5m and not 340m

## 2021-12-28 ENCOUNTER — Telehealth: Payer: Self-pay | Admitting: Family Medicine

## 2021-12-28 MED ORDER — SEMAGLUTIDE(0.25 OR 0.5MG/DOS) 2 MG/1.5ML ~~LOC~~ SOPN: 2.5000 mg | PEN_INJECTOR | SUBCUTANEOUS | 1 refills | Status: DC

## 2021-12-28 NOTE — Telephone Encounter (Signed)
Printed  Semaglutide 2.5 mg puridoxine '10mg'$ /ml Prescrible 2 ml Faxed to [facility] at [phone]

## 2021-12-28 NOTE — Telephone Encounter (Signed)
Received approval for PA.

## 2021-12-28 NOTE — Telephone Encounter (Signed)
PA Ozempic KEY:  [id number] WAITING response from covermy meds

## 2021-12-28 NOTE — Telephone Encounter (Signed)
They are dictated by the the board of pharmacy and it is approved through FDA from the board of pharmacy They received guidelines on how to make it.

## 2021-12-31 ENCOUNTER — Telehealth: Payer: Self-pay

## 2021-12-31 NOTE — Telephone Encounter (Signed)
Pharmacist called to get a verbal for the pts  Semaglutide,0.25 or 0.'5MG'$ /DOS, 2 MG/1.5ML SOPN medication. The rx was sent in as print so they did not get the rx. Verbal given with clarification from provider. 0.'5mg'$  once weekly with 0 refills. Once we see how he does on this dose for the month we will see if it needs to be bumped up to 1. Mg or not at next visit. Pharmacist stated understanding.

## 2022-01-01 ENCOUNTER — Ambulatory Visit (AMBULATORY_SURGERY_CENTER): Payer: BC Managed Care – PPO

## 2022-01-01 VITALS — Ht 67.0 in | Wt 207.0 lb

## 2022-01-01 DIAGNOSIS — Z86010 Personal history of colonic polyps: Secondary | ICD-10-CM

## 2022-01-01 MED ORDER — NA SULFATE-K SULFATE-MG SULF 17.5-3.13-1.6 GM/177ML PO SOLN: 1.0000 | ORAL | 0 refills | Status: DC

## 2022-01-01 NOTE — Progress Notes (Signed)
No egg or soy allergy known to patient  No issues known to pt with past sedation with any surgeries or procedures Patient denies ever being told they had issues or difficulty with intubation  No FH of Malignant Hyperthermia Pt is not on diet pills Pt is not on  home 02  Pt is not on blood thinners  Pt reports mild issues with constipation on Ozempic (for weight loss). Takes Miralax daily with good result. He will continue Miralax until day before procedure. He will hold his Ozempic usually taken on Saturdays. No A fib or A flutter Have any cardiac testing pending--denied Pt instructed to use Single[employer]com or GoodRx for a price reduction on prep

## 2022-01-02 ENCOUNTER — Other Ambulatory Visit: Payer: Self-pay

## 2022-01-02 NOTE — Telephone Encounter (Signed)
After speaking to the pharmacist at [facility] she recommended in the future to eliminate confusion/phone calls that the prescription be written as discussed on the phone today.

## 2022-01-03 DIAGNOSIS — M519 Unspecified thoracic, thoracolumbar and lumbosacral intervertebral disc disorder: Secondary | ICD-10-CM | POA: Diagnosis not present

## 2022-01-03 DIAGNOSIS — M5416 Radiculopathy, lumbar region: Secondary | ICD-10-CM | POA: Diagnosis not present

## 2022-01-03 DIAGNOSIS — M7061 Trochanteric bursitis, right hip: Secondary | ICD-10-CM | POA: Diagnosis not present

## 2022-01-14 ENCOUNTER — Telehealth: Payer: Self-pay | Admitting: Orthopaedic Surgery

## 2022-01-14 ENCOUNTER — Encounter: Payer: Self-pay | Admitting: Gastroenterology

## 2022-01-14 NOTE — Telephone Encounter (Signed)
Pt called and is wondering if Dr.Bokshan can refer someone for him to see about his back pain.

## 2022-01-28 ENCOUNTER — Other Ambulatory Visit: Payer: Self-pay | Admitting: Family Medicine

## 2022-01-28 ENCOUNTER — Encounter: Payer: Self-pay | Admitting: Family Medicine

## 2022-01-28 MED ORDER — OZEMPIC (1 MG/DOSE) 2 MG/1.5ML ~~LOC~~ SOPN: 1.0000 mg | PEN_INJECTOR | SUBCUTANEOUS | 0 refills | Status: DC

## 2022-01-29 ENCOUNTER — Ambulatory Visit (AMBULATORY_SURGERY_CENTER): Payer: BC Managed Care – PPO | Admitting: Gastroenterology

## 2022-01-29 ENCOUNTER — Encounter: Payer: Self-pay | Admitting: Gastroenterology

## 2022-01-29 VITALS — BP 114/66 | HR 74 | Temp 97.1°F | Resp 8 | Ht 67.0 in | Wt 207.0 lb

## 2022-01-29 DIAGNOSIS — Z86010 Personal history of colonic polyps: Secondary | ICD-10-CM

## 2022-01-29 DIAGNOSIS — D127 Benign neoplasm of rectosigmoid junction: Secondary | ICD-10-CM

## 2022-01-29 DIAGNOSIS — Z09 Encounter for follow-up examination after completed treatment for conditions other than malignant neoplasm: Secondary | ICD-10-CM | POA: Diagnosis not present

## 2022-01-29 DIAGNOSIS — D123 Benign neoplasm of transverse colon: Secondary | ICD-10-CM

## 2022-01-29 DIAGNOSIS — Z1211 Encounter for screening for malignant neoplasm of colon: Secondary | ICD-10-CM | POA: Diagnosis not present

## 2022-01-29 MED ORDER — SODIUM CHLORIDE 0.9 % IV SOLN: 500.0000 mL | Freq: Once | INTRAVENOUS | Status: DC

## 2022-01-29 NOTE — Op Note (Signed)
[facility] Patient Name: Ryan Middleton Procedure Date: 01/29/2022 9:18 AM MRN: [id number] Endoscopist: Justice Britain , MD Age: 56 Referring MD:  Date of Birth: [birth year] Gender: Male Account #: [id number] Procedure:                Colonoscopy Indications:              Surveillance: Personal history of adenomatous                            polyps on last colonoscopy > 3 years ago Medicines:                Monitored Anesthesia Care Procedure:                Pre-Anesthesia Assessment:                           - Prior to the procedure, a History and Physical                            was performed, and patient medications and                            allergies were reviewed. The patient's tolerance of                            previous anesthesia was also reviewed. The risks                            and benefits of the procedure and the sedation                            options and risks were discussed with the patient.                            All questions were answered, and informed consent                            was obtained. Prior Anticoagulants: The patient has                            taken no previous anticoagulant or antiplatelet                            agents except for aspirin. ASA Grade Assessment: II                            - A patient with mild systemic disease. After                            reviewing the risks and benefits, the patient was                            deemed in satisfactory condition to undergo the  procedure.                           After obtaining informed consent, the colonoscope                            was passed under direct vision. Throughout the                            procedure, the patient's blood pressure, pulse, and                            oxygen saturations were monitored continuously. The                            CF [id number] #[id number] was introduced through the  anus                            and advanced to the 5 cm into the ileum. The                            colonoscopy was performed without difficulty. The                            patient tolerated the procedure. The quality of the                            bowel preparation was adequate. The terminal ileum,                            ileocecal valve, appendiceal orifice, and rectum                            were photographed. Scope In: 9:27:27 AM Scope Out: 9:43:54 AM Scope Withdrawal Time: 0 hours 12 minutes 52 seconds  Total Procedure Duration: 0 hours 16 minutes 27 seconds  Findings:                 The digital rectal exam findings include                            hemorrhoids. Pertinent negatives include no                            palpable rectal lesions.                           A moderate amount of semi-liquid stool was found in                            the entire colon, interfering with visualization.                            Lavage of the area was performed using copious  amounts, resulting in clearance with adequate                            visualization.                           The terminal ileum and ileocecal valve appeared                            normal.                           Two sessile polyps were found in the recto-sigmoid                            colon and transverse colon. The polyps were 3 to 6                            mm in size. These polyps were removed with a cold                            snare. Resection and retrieval were complete.                           Scattered multiple small-mouthed diverticula were                            found in the entire colon.                           Normal mucosa was found in the entire colon                            otherwise.                           Non-bleeding non-thrombosed external and internal                            hemorrhoids were found during  retroflexion, during                            perianal exam and during digital exam. The                            hemorrhoids were Grade II (internal hemorrhoids                            that prolapse but reduce spontaneously). Complications:            No immediate complications. Estimated Blood Loss:     Estimated blood loss was minimal. Impression:               - Hemorrhoids found on digital rectal exam.                           -  Stool in the entire examined colon.                           - The examined portion of the ileum was normal.                           - Two 3 to 6 mm polyps at the recto-sigmoid colon                            and in the transverse colon, removed with a cold                            snare. Resected and retrieved.                           - Scattered diverticulosis in the entire examined                            colon.                           - Normal mucosa in the entire examined colon                            otherwise.                           - Non-bleeding non-thrombosed external and internal                            hemorrhoids. Recommendation:           - The patient will be observed post-procedure,                            until all discharge criteria are met.                           - Discharge patient to home.                           - Patient has a contact number available for                            emergencies. The signs and symptoms of potential                            delayed complications were discussed with the                            patient. Return to normal activities tomorrow.                            Written discharge instructions were provided to the  patient.                           - High fiber diet.                           - Use FiberCon 1-2 tablets PO daily.                           - Continue present medications.                           - Await pathology  results.                           - Repeat colonoscopy in 5-7 years for surveillance                            based on pathology results and findings of previous                            precancerous colon polyps.                           - The findings and recommendations were discussed                            with the patient.                           - The findings and recommendations were discussed                            with the patient's family. Justice Britain, MD 01/29/2022 9:50:27 AM

## 2022-01-29 NOTE — Patient Instructions (Addendum)
Handouts Provided:  Polyps, Diverticulosis and High Fiber Diet  - The patient will be observed post-procedure, until all discharge criteria are met. - Discharge patient to home. - Patient has a contact number available for emergencies. The signs and symptoms of potential delayed complications were discussed with the patient. Return to normal activities tomorrow. Written discharge instructions were provided to the patient. - High fiber diet. - Use FiberCon 1-2 tablets PO daily. - Continue present medications. - Await pathology results. - Repeat colonoscopy in 5-7 years for surveillance based on pathology results and findings of previous precancerous colon polyps. - The findings and recommendations were discussed with the patient. - The findings and recommendations were discussed with the patient's family.  YOU HAD AN ENDOSCOPIC PROCEDURE TODAY AT [facility]:   Refer to the procedure report that was given to you for any specific questions about what was found during the examination.  If the procedure report does not answer your questions, please call your gastroenterologist to clarify.  If you requested that your care partner not be given the details of your procedure findings, then the procedure report has been included in a sealed envelope for you to review at your convenience later.  YOU SHOULD EXPECT: Some feelings of bloating in the abdomen. Passage of more gas than usual.  Walking can help get rid of the air that was put into your GI tract during the procedure and reduce the bloating. If you had a lower endoscopy (such as a colonoscopy or flexible sigmoidoscopy) you may notice spotting of blood in your stool or on the toilet paper. If you underwent a bowel prep for your procedure, you may not have a normal bowel movement for a few days.  Please Note:  You might notice some irritation and congestion in your nose or some drainage.  This is from the oxygen used during your  procedure.  There is no need for concern and it should clear up in a day or so.  SYMPTOMS TO REPORT IMMEDIATELY:  Following lower endoscopy (colonoscopy or flexible sigmoidoscopy):  Excessive amounts of blood in the stool  Significant tenderness or worsening of abdominal pains  Swelling of the abdomen that is new, acute  Fever of 100F or higher  For urgent or emergent issues, a gastroenterologist can be reached at any hour by calling [phone]. Do not use MyChart messaging for urgent concerns.    DIET:  We do recommend a small meal at first, but then you may proceed to your regular diet.  Drink plenty of fluids but you should avoid alcoholic beverages for 24 hours.  ACTIVITY:  You should plan to take it easy for the rest of today and you should NOT DRIVE or use heavy machinery until tomorrow (because of the sedation medicines used during the test).    FOLLOW UP: Our staff will call the number listed on your records the next business day following your procedure.  We will call around 7:15- 8:00 am to check on you and address any questions or concerns that you may have regarding the information given to you following your procedure. If we do not reach you, we will leave a message.     If any biopsies were taken you will be contacted by phone or by letter within the next 1-3 weeks.  Please call us at [phone] if you have not heard about the biopsies in 3 weeks.    SIGNATURES/CONFIDENTIALITY: You and/or your care partner have signed paperwork  which will be entered into your electronic medical record.  These signatures attest to the fact that that the information above on your After Visit Summary has been reviewed and is understood.  Full responsibility of the confidentiality of this discharge information lies with you and/or your care-partner.

## 2022-01-29 NOTE — Progress Notes (Signed)
GASTROENTEROLOGY PROCEDURE H&P NOTE   Primary Care Physician: Shelda Pal, DO  HPI: Ryan Middleton is a 56 y.o. male who presents for colonoscopy for surveillance of previous adenomatous polyps.  Past Medical History:  Diagnosis Date   Hypertension    Post traumatic stress disorder    Sleep apnea    Stroke [facility])    TIA 2021   Past Surgical History:  Procedure Laterality Date   APPENDECTOMY     DISTAL BICEPS TENDON REPAIR Right 04/20/2021   Procedure: RIGHT DISTAL BICEPS REPAIR;  Surgeon: [name], MD;  Location: [employer];  Service: Orthopedics;  Laterality: Right;   FOOT SURGERY Bilateral    plantar fascitis   Current Outpatient Medications  Medication Sig Dispense Refill   aspirin EC 325 MG tablet Take 1 tablet (325 mg total) by mouth daily. (Patient not taking: Reported on 01/01/2022) 30 tablet 0   atorvastatin (LIPITOR) 80 MG tablet Take 1 tablet (80 mg total) by mouth daily. 30 tablet 3   buPROPion (ZYBAN) 150 MG 12 hr tablet 1 week before quit date, take 1 tab daily for 3 days and then 1 tab twice daily. 60 tablet 2   Na Sulfate-K Sulfate-Mg Sulf 17.5-3.13-1.6 GM/177ML SOLN Take 1 kit by mouth as directed. May use generic Suprep, no prior authorization. Take as directed. 354 mL 0   nicotine (NICODERM CQ - DOSED IN MG/24 HR) 7 mg/24hr patch Place 1 patch (7 mg total) onto the skin daily. 14 patch 0   propranolol ER (INDERAL LA) 80 MG 24 hr capsule Take 1 capsule (80 mg total) by mouth daily. 90 capsule 3   QUEtiapine (SEROQUEL) 100 MG tablet Take 100 mg by mouth at bedtime.     Semaglutide, 1 MG/DOSE, (OZEMPIC, 1 MG/DOSE,) 2 MG/1.5ML SOPN Inject 1 mg into the skin once a week. 1.5 mL 0   Semaglutide,0.25 or 0.5MG/DOS, 2 MG/1.5ML SOPN Inject 2.5 mg into the skin once a week. 2 mL 1   Semaglutide-Weight Management 2.4 MG/0.75ML SOAJ Inject 2.4 mg into the skin once a week. 3 mL 1   No current facility-administered medications for this visit.    Current  Outpatient Medications:    aspirin EC 325 MG tablet, Take 1 tablet (325 mg total) by mouth daily. (Patient not taking: Reported on 01/01/2022), Disp: 30 tablet, Rfl: 0   atorvastatin (LIPITOR) 80 MG tablet, Take 1 tablet (80 mg total) by mouth daily., Disp: 30 tablet, Rfl: 3   buPROPion (ZYBAN) 150 MG 12 hr tablet, 1 week before quit date, take 1 tab daily for 3 days and then 1 tab twice daily., Disp: 60 tablet, Rfl: 2   Na Sulfate-K Sulfate-Mg Sulf 17.5-3.13-1.6 GM/177ML SOLN, Take 1 kit by mouth as directed. May use generic Suprep, no prior authorization. Take as directed., Disp: 354 mL, Rfl: 0   nicotine (NICODERM CQ - DOSED IN MG/24 HR) 7 mg/24hr patch, Place 1 patch (7 mg total) onto the skin daily., Disp: 14 patch, Rfl: 0   propranolol ER (INDERAL LA) 80 MG 24 hr capsule, Take 1 capsule (80 mg total) by mouth daily., Disp: 90 capsule, Rfl: 3   QUEtiapine (SEROQUEL) 100 MG tablet, Take 100 mg by mouth at bedtime., Disp: , Rfl:    Semaglutide, 1 MG/DOSE, (OZEMPIC, 1 MG/DOSE,) 2 MG/1.5ML SOPN, Inject 1 mg into the skin once a week., Disp: 1.5 mL, Rfl: 0   Semaglutide,0.25 or 0.5MG/DOS, 2 MG/1.5ML SOPN, Inject 2.5 mg into the skin once a week.,  Disp: 2 mL, Rfl: 1   Semaglutide-Weight Management 2.4 MG/0.75ML SOAJ, Inject 2.4 mg into the skin once a week., Disp: 3 mL, Rfl: 1 Allergies  Allergen Reactions   Hydrocodone Nausea And Vomiting   Oxycodone Nausea Only   Simvastatin Rash   Family History  Adopted: Yes   Social History   Socioeconomic History   Marital status: Married    Spouse name: Not on file   Number of children: Not on file   Years of education: Not on file   Highest education level: Not on file  Occupational History   Not on file  Tobacco Use   Smoking status: Every Day    Packs/day: 0.50    Years: 30.00    Total pack years: 15.00    Types: Cigarettes   Smokeless tobacco: Never  Vaping Use   Vaping Use: Never used  Substance and Sexual Activity   Alcohol use: Not  Currently    Comment: occ   Drug use: No   Sexual activity: Not on file  Other Topics Concern   Not on file  Social History Narrative   Not on file   Social Determinants of Health   Financial Resource Strain: Not on file  Food Insecurity: Not on file  Transportation Needs: Not on file  Physical Activity: Not on file  Stress: Not on file  Social Connections: Not on file  Intimate Partner Violence: Not on file    Physical Exam: There were no vitals filed for this visit. There is no height or weight on file to calculate BMI. GEN: NAD EYE: Sclerae anicteric ENT: MMM CV: Non-tachycardic GI: Soft, NT/ND NEURO:  Alert & Oriented x 3  Lab Results: No results for input(s): "WBC", "HGB", "HCT", "PLT" in the last 72 hours. BMET No results for input(s): "NA", "K", "CL", "CO2", "GLUCOSE", "BUN", "CREATININE", "CALCIUM" in the last 72 hours. LFT No results for input(s): "PROT", "ALBUMIN", "AST", "ALT", "ALKPHOS", "BILITOT", "BILIDIR", "IBILI" in the last 72 hours. PT/INR No results for input(s): "LABPROT", "INR" in the last 72 hours.   Impression / Plan: This is a 56 y.o.male who presents for colonoscopy for surveillance of previous adenomatous polyps.  The risks and benefits of endoscopic evaluation/treatment were discussed with the patient and/or family; these include but are not limited to the risk of perforation, infection, bleeding, missed lesions, lack of diagnosis, severe illness requiring hospitalization, as well as anesthesia and sedation related illnesses.  The patient's history has been reviewed, patient examined, no change in status, and deemed stable for procedure.  The patient and/or family is agreeable to proceed.    Ryan Britain, MD [facility] Office # [phone]

## 2022-01-29 NOTE — Progress Notes (Signed)
Report to pacu rn. Vss. Care resumed by rn. 

## 2022-01-29 NOTE — Progress Notes (Signed)
Pt's states no medical or surgical changes since previsit or office visit. 

## 2022-01-30 ENCOUNTER — Telehealth: Payer: Self-pay

## 2022-01-30 NOTE — Telephone Encounter (Signed)
Follow up call placed, no answer and no VM. SChaplin, RN,BSN

## 2022-02-01 ENCOUNTER — Other Ambulatory Visit: Payer: Self-pay | Admitting: Family Medicine

## 2022-02-01 ENCOUNTER — Encounter: Payer: Self-pay | Admitting: Gastroenterology

## 2022-02-01 MED ORDER — OZEMPIC (1 MG/DOSE) 2 MG/1.5ML ~~LOC~~ SOPN: 1.0000 mg | PEN_INJECTOR | SUBCUTANEOUS | 0 refills | Status: DC

## 2022-02-04 ENCOUNTER — Ambulatory Visit (INDEPENDENT_AMBULATORY_CARE_PROVIDER_SITE_OTHER): Payer: BC Managed Care – PPO | Admitting: Orthopedic Surgery

## 2022-02-04 ENCOUNTER — Encounter: Payer: Self-pay | Admitting: Orthopedic Surgery

## 2022-02-04 ENCOUNTER — Ambulatory Visit (INDEPENDENT_AMBULATORY_CARE_PROVIDER_SITE_OTHER): Payer: BC Managed Care – PPO

## 2022-02-04 VITALS — BP 142/92 | HR 73 | Ht 67.0 in | Wt 210.0 lb

## 2022-02-04 DIAGNOSIS — M545 Low back pain, unspecified: Secondary | ICD-10-CM | POA: Diagnosis not present

## 2022-02-04 DIAGNOSIS — M7062 Trochanteric bursitis, left hip: Secondary | ICD-10-CM

## 2022-02-04 NOTE — Progress Notes (Signed)
Orthopedic Spine Surgery Office Note  Assessment: Patient is a 56 y.o. male with who presents with right hip and left sided radicular pain. His radicular pain is not presents at this time but he describes a L5 distribution. His main issue right now is his right hip. He has trochanteric bursitis and osteoarthritis.    Plan: -Explained that initially conservative treatment is tried as a significant number of patients may experience relief with these treatment modalities. Discussed that the conservative treatments include:  -activity modification  -physical therapy  -over the counter pain medications  -steroid injections -Patient has tried OTC pain medications and activity modifications -Patient is interested in trying PT, home exercises, and ibuprofen for the trochanteric bursitis and hip arthritis on the right side. Encouraged him to do the home exercises regularly and to only take the ibuprofen for 10-14 days. Explained that if it does not resolve, could consider either an intraarticular injection or a bursa injection depending on which one is more symptomatic  -In regards to his radicular pain, he is not currently having any so I did not recommend any treatment at this time -Patient should return to office on an as needed basis, repeat x-rays of lumbar spine at next visit: none   Patient expressed understanding of the plan and all questions were answered to their satisfaction.   ___________________________________________________________________________   History:  Patient is a 56 y.o. male who presents today for lumbar spine. He has two problems.  His first problem is with his low back, buttock, and left leg. 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 This was many years ago. Within the past year, he has noticed it radiates down the left leg. It often radiates into his left testicle as well. He has decreased sensation in that same  distribution. No paresthesias. He notes that in the leg the pain goes down the back of the thigh and most of the time does not go down the leg but when it does it goes into the dorsum of his foot. He has not noticed anything that makes it better or worse. He is currently not experiencing this pain.  His other issue is right hip pain. He feels it over his lateral hip. It started about six months ago without any trauma or injury. Thinks it may have started in compensating for his back. He has trouble sleeping on that side because of the pain. He feels with activity as well especially with any activity that requires rotation through the hip. It improves with rest. Ibuprofen does help. He does not have any pain radiating down his leg on this side. No numbness or paresthesias on this side.    Weakness: denies Symptoms of imbalance: denies Bowel or bladder incontinence: denies Saddle anesthesia: denies Symptoms:  -leg cramping: denies  -leg heaviness: denies  -Symptoms improve with back flexion: no  Treatments tried: Ibuprofen, activity modification  Review of systems: Denies fevers and chills, night sweats, unexplained weight loss, history of cancer, pain that wakes them at night  Past medical history: TIA HLD  Allergies: hydrocodone, oxycodone, simvastatin  Past surgical history:  Appendectomy Distal biceps tendon repair Foot surgery   Social history: Reports use of nicotine product (smoking, vaping, patches, smokeless), 0.5ppd Alcohol use: occasional Denies recreational drug use   Physical Exam:  General: no acute distress, appears stated age Neurologic: alert, answering questions appropriately, following commands Respiratory: unlabored breathing on room air, symmetric chest rise Psychiatric:  appropriate affect, normal cadence to speech   MSK (spine):  -Strength exam      Left  Right EHL    5/5  5/5 TA    5/5  5/5 GSC    5/5  5/5 Knee extension  5/5  5/5 Hip  flexion   5/5  5/5  -Sensory exam    Sensation intact to light touch in L3-S1 nerve distributions of bilateral lower extremities  -Achilles DTR: 2/4 on the left, 2/4 on the right -Patellar tendon DTR: 2/4 on the left, 2/4 on the right  -Straight leg raise: negative -Contralateral straight leg raise:negative -Femoral nerve stretch test: negative  -Left hip exam: no pain through range of motion, negative FABER, negative stinchfield -Right hip exam: pain with internal rotation past neutral, no pain with external rotation, negative faber, negative stinchfield, TTP over the trochanteric bursa  Imaging: XR of the lumbar spine from 02/04/2022 was independently reviewed and interpreted, showing disc height loss at L4/5 and L5/S1. Retrolisthesis of L4 on L5. No evidence of instability. No acute osseous abnormality. On the AP view, can see early degenerative changes within the hip joints.    Patient name: Ryan Middleton Patient MRN: [id number] Date of visit: 02/04/22

## 2022-02-05 MED ORDER — OZEMPIC (1 MG/DOSE) 2 MG/1.5ML ~~LOC~~ SOPN: 1.0000 mg | PEN_INJECTOR | SUBCUTANEOUS | 1 refills | Status: DC

## 2022-02-05 NOTE — Addendum Note (Signed)
Addended by: Sharon Seller B on: 02/05/2022 01:52 PM   Modules accepted: Orders

## 2022-02-13 ENCOUNTER — Encounter: Payer: Self-pay | Admitting: Family Medicine

## 2022-02-13 DIAGNOSIS — Z720 Tobacco use: Secondary | ICD-10-CM

## 2022-02-14 MED ORDER — BUPROPION HCL ER (SMOKING DET) 150 MG PO TB12: ORAL_TABLET | ORAL | 2 refills | Status: DC

## 2022-02-20 ENCOUNTER — Encounter (HOSPITAL_BASED_OUTPATIENT_CLINIC_OR_DEPARTMENT_OTHER): Payer: Self-pay

## 2022-02-20 ENCOUNTER — Ambulatory Visit (HOSPITAL_BASED_OUTPATIENT_CLINIC_OR_DEPARTMENT_OTHER): Payer: BC Managed Care – PPO | Admitting: Physical Therapy

## 2022-02-25 ENCOUNTER — Encounter: Payer: Self-pay | Admitting: Family Medicine

## 2022-02-25 MED ORDER — ATORVASTATIN CALCIUM 80 MG PO TABS: 80.0000 mg | ORAL_TABLET | Freq: Every day | ORAL | 3 refills | Status: DC

## 2022-03-11 ENCOUNTER — Other Ambulatory Visit: Payer: Self-pay | Admitting: Family Medicine

## 2022-03-11 ENCOUNTER — Encounter: Payer: Self-pay | Admitting: Family Medicine

## 2022-03-11 MED ORDER — QUETIAPINE FUMARATE 100 MG PO TABS: 100.0000 mg | ORAL_TABLET | Freq: Every day | ORAL | 2 refills | Status: DC

## 2022-03-18 ENCOUNTER — Other Ambulatory Visit: Payer: Self-pay | Admitting: Family Medicine

## 2022-03-18 MED ORDER — SEMAGLUTIDE (2 MG/DOSE) 8 MG/3ML ~~LOC~~ SOPN: 2.0000 mg | PEN_INJECTOR | SUBCUTANEOUS | 1 refills | Status: DC

## 2022-03-26 ENCOUNTER — Ambulatory Visit (INDEPENDENT_AMBULATORY_CARE_PROVIDER_SITE_OTHER): Payer: BC Managed Care – PPO | Admitting: Family Medicine

## 2022-03-26 ENCOUNTER — Encounter: Payer: Self-pay | Admitting: Family Medicine

## 2022-03-26 VITALS — BP 120/72 | HR 81 | Temp 98.1°F | Ht 67.0 in | Wt 197.0 lb

## 2022-03-26 DIAGNOSIS — Z720 Tobacco use: Secondary | ICD-10-CM

## 2022-03-26 DIAGNOSIS — Z23 Encounter for immunization: Secondary | ICD-10-CM

## 2022-03-26 DIAGNOSIS — F411 Generalized anxiety disorder: Secondary | ICD-10-CM

## 2022-03-26 DIAGNOSIS — E669 Obesity, unspecified: Secondary | ICD-10-CM | POA: Diagnosis not present

## 2022-03-26 MED ORDER — BUPROPION HCL ER (XL) 150 MG PO TB24: 150.0000 mg | ORAL_TABLET | Freq: Every day | ORAL | 2 refills | Status: DC

## 2022-03-26 NOTE — Patient Instructions (Signed)
Keep the diet clean and stay active.  Let me know if the new Wellbutrin costs anything.  Let us know if you need anything.

## 2022-03-26 NOTE — Progress Notes (Signed)
Chief Complaint  Patient presents with   Medication Problem    Discuss Wellbutrin     Subjective: Patient is a 56 y.o. male here for follow up.  Patient has a history of obesity.  He is to be class II with serious comorbidity but he has lost 30 pounds.  He is currently taking semaglutide 2 mg weekly.  He reports compliance and decreased appetite.  He is having some constipation issues.  He has started taking a fiber supplement a few times per week.  He will also have sulfur burps if he eats greens.  The side effects are not bothersome enough to change his regimen.  He was started on bupropion for smoking cessation.  While it has not done anything for helping him quit smoking, his wife notices he is less on edge.  He admits he likely has some underlying anxiety.  He is not following with a counselor or therapist.  Past Medical History:  Diagnosis Date   Hypertension    Post traumatic stress disorder    Sleep apnea    Stroke ([place])    TIA 2021    Objective: BP 120/72 (BP Location: Left Arm, Patient Position: Sitting, Cuff Size: Normal)   Pulse 81   Temp 98.1 F (36.7 C) (Oral)   Ht '5\' 7"'$  (1.702 m)   Wt 197 lb (89.4 kg)   SpO2 97%   BMI 30.85 kg/m  General: Awake, appears stated age Heart: RRR, no LE edema Lungs: CTAB, no rales, wheezes or rhonchi. No accessory muscle use Abdomen: Bowel sounds present, soft, nontender, nondistended Psych: Age appropriate judgment and insight, normal affect and mood  Assessment and Plan: Tobacco abuse  Obesity (BMI 30-39.9)  GAD (generalized anxiety disorder)  Chronic, not controlled.  He is improving, will continue bupropion but mainly for #3. Chronic, improving.  Continue semaglutide 2 mg weekly.  Counseled on diet and exercise.  He looks like he is moving much better.  He will start taking fiber supplements daily for bowel movements.  He did make up some urinary hesitancy which could be due to constipation.  Once his bowel movements are  improved, if he still having issues we will consider prostate conditions. Chronic, stable.  Change bupropion 150 mg twice daily to Wellbutrin 150 mg XL daily.  He will let me know if there are cost issues with this. PCV 20 today. I will see him in 6 months for his physical or as needed. The patient voiced understanding and agreement to the plan.  [place], DO 03/26/22  4:13 PM

## 2022-04-15 ENCOUNTER — Encounter: Payer: Self-pay | Admitting: Family Medicine

## 2022-04-15 MED ORDER — SEMAGLUTIDE (2 MG/DOSE) 8 MG/3ML ~~LOC~~ SOPN: 2.0000 mg | PEN_INJECTOR | SUBCUTANEOUS | 1 refills | Status: DC

## 2022-04-24 ENCOUNTER — Ambulatory Visit (INDEPENDENT_AMBULATORY_CARE_PROVIDER_SITE_OTHER): Payer: BC Managed Care – PPO | Admitting: Medical

## 2022-04-24 VITALS — BP 128/86 | HR 85 | Temp 98.1°F | Resp 18 | Ht 67.0 in | Wt 180.0 lb

## 2022-04-24 DIAGNOSIS — R0982 Postnasal drip: Secondary | ICD-10-CM | POA: Diagnosis not present

## 2022-04-24 DIAGNOSIS — R059 Cough, unspecified: Secondary | ICD-10-CM | POA: Diagnosis not present

## 2022-04-24 DIAGNOSIS — J069 Acute upper respiratory infection, unspecified: Secondary | ICD-10-CM

## 2022-04-24 LAB — POC COVID19 BINAXNOW: SARS Coronavirus 2 Ag: NEGATIVE

## 2022-04-24 MED ORDER — BENZONATATE 100 MG PO CAPS: 100.0000 mg | ORAL_CAPSULE | Freq: Three times a day (TID) | ORAL | 0 refills | Status: DC | PRN

## 2022-04-24 MED ORDER — FLUTICASONE PROPIONATE 50 MCG/ACT NA SUSP: 2.0000 | Freq: Every day | NASAL | 1 refills | Status: DC

## 2022-04-24 MED ORDER — LEVOCETIRIZINE DIHYDROCHLORIDE 5 MG PO TABS: 5.0000 mg | ORAL_TABLET | Freq: Every evening | ORAL | 3 refills | Status: DC

## 2022-04-24 MED ORDER — GUAIFENESIN-CODEINE 100-10 MG/5ML PO SYRP: 5.0000 mL | ORAL_SOLUTION | Freq: Three times a day (TID) | ORAL | 0 refills | Status: DC | PRN

## 2022-04-24 NOTE — Progress Notes (Signed)
Subjective:    Patient ID: Ryan Middleton, male    DOB: 1965-05-07, 56 y.o.   MRN: [id number]  HPI  Pt states on Friday he got cough. He states he can feel a lot of pnd. During the day cough not as bad. At night he states feels like some much drainage he has to spit out drainage. More nasal congestion at night.  Not using anything over the counter.  Describes a lot cough due to PND. He can't sleep.  No wheezing with cough.  In past when temp changes he often will get this.  He states no fever, no chills, no sweats.    Review of Systems  Constitutional:  Negative for chills, fatigue and fever.  HENT:  Positive for congestion. Negative for postnasal drip, sneezing, sore throat and voice change.   Respiratory:  Positive for cough. Negative for chest tightness and wheezing.   Cardiovascular:  Negative for chest pain and palpitations.  Gastrointestinal:  Negative for abdominal pain.  Genitourinary:  Negative for dysuria.  Musculoskeletal:  Negative for back pain.    Past Medical History:  Diagnosis Date   Hypertension    Post traumatic stress disorder    Sleep apnea    Stroke [facility])    TIA 2021     Social History   Socioeconomic History   Marital status: Married    Spouse name: Not on file   Number of children: Not on file   Years of education: Not on file   Highest education level: Not on file  Occupational History   Not on file  Tobacco Use   Smoking status: Every Day    Packs/day: 0.50    Years: 30.00    Total pack years: 15.00    Types: Cigarettes   Smokeless tobacco: Never  Vaping Use   Vaping Use: Never used  Substance and Sexual Activity   Alcohol use: Not Currently    Comment: occ   Drug use: No   Sexual activity: Not on file  Other Topics Concern   Not on file  Social History Narrative   Not on file   Social Determinants of Health   Financial Resource Strain: Not on file  Food Insecurity: Not on file  Transportation Needs: Not on file   Physical Activity: Not on file  Stress: Not on file  Social Connections: Not on file  Intimate Partner Violence: Not on file    Past Surgical History:  Procedure Laterality Date   APPENDECTOMY     DISTAL BICEPS TENDON REPAIR Right 04/20/2021   Procedure: RIGHT DISTAL [place];  Surgeon: [name], MD;  Location: [place];  Service: Orthopedics;  Laterality: Right;   FOOT SURGERY Bilateral    plantar fascitis    Family History  Adopted: Yes    Allergies  Allergen Reactions   Hydrocodone Nausea And Vomiting   Oxycodone Nausea Only   Simvastatin Rash    Current Outpatient Medications on File Prior to Visit  Medication Sig Dispense Refill   aspirin EC 325 MG tablet Take 1 tablet (325 mg total) by mouth daily. 30 tablet 0   atorvastatin (LIPITOR) 80 MG tablet Take 1 tablet (80 mg total) by mouth daily. 90 tablet 3   buPROPion (WELLBUTRIN XL) 150 MG 24 hr tablet Take 1 tablet (150 mg total) by mouth daily. 90 tablet 2   nicotine (NICODERM CQ - DOSED IN MG/24 HR) 7 mg/24hr patch Place 1 patch (7 mg total) onto the skin daily. [place]  patch 0   propranolol ER (INDERAL LA) 80 MG 24 hr capsule Take 1 capsule (80 mg total) by mouth daily. 90 capsule 3   QUEtiapine (SEROQUEL) 100 MG tablet Take 1 tablet (100 mg total) by mouth at bedtime. 90 tablet 2   Semaglutide, 2 MG/DOSE, 8 MG/3ML SOPN Inject 2 mg as directed once a week. 9 mL 1   No current facility-administered medications on file prior to visit.    BP 128/86   Pulse 85   Temp 98.1 F (36.7 C)   Resp 18   Ht '5\' 7"'$  (1.702 m)   Wt 180 lb (81.6 kg)   SpO2 100%   BMI 28.19 kg/m        Objective:   Physical Exam  General Mental Status- Alert. General Appearance- Not in acute distress.   Skin General: Color- Normal Color. Moisture- Normal Moisture.  Neck Carotid Arteries- Normal color. Moisture- Normal Moisture. No carotid bruits. No JVD.  Chest and Lung Exam Auscultation: Breath  Sounds:-Normal.  Cardiovascular Auscultation:Rythm- Regular. Murmurs & Other Heart Sounds:Auscultation of the heart reveals- No Murmurs.  Abdomen Inspection:-Inspeection Normal. Palpation/Percussion:Note:No mass. Palpation and Percussion of the abdomen reveal- Non Tender, Non Distended + BS, no rebound or guarding.   Neurologic Cranial Nerve exam:- CN III-XII intact(No nystagmus), symmetric smile. Strength:- 5/5 equal and symmetric strength both upper and lower extremities.    Heent- no sinus pressure, boggy turbinates, +pnd, tm normal. Some wax present in canals.     Assessment & Plan:   Patient Instructions  Upper respiratory infection  vs allergic rhinitis with significant pnd and cough. For nasal congestion rx flonase, xzal anthisatamine  and rx cheratussin AC for cough.  For cough at work rx benzonatate. Not to use cough syrup at work.  Wellbutrin can decrease effect codeine per computer program/epic. We discussed and will hold wellbutrin temporarily. Benefits vs risk discussed.  Presently does not appear antibiotic needed.  Follow up in 7 days or sooner if needed.   Mackie Pai, PA-C

## 2022-04-24 NOTE — Patient Instructions (Addendum)
Upper respiratory infection  vs allergic rhinitis with significant pnd and cough. For nasal congestion rx flonase, xzal anthisatamine  and rx cheratussin AC for cough.  For cough at work rx benzonatate. Not to use cough syrup at work.  Wellbutrin can decrease effect codeine per computer program/epic. We discussed and will hold wellbutrin temporarily. Benefits vs risk discussed.  Presently does not appear antibiotic needed.  Follow up in 7 days or sooner if needed.

## 2022-04-25 ENCOUNTER — Encounter: Payer: Self-pay | Admitting: Family Medicine

## 2022-04-26 ENCOUNTER — Other Ambulatory Visit: Payer: Self-pay | Admitting: Family Medicine

## 2022-04-26 MED ORDER — SEMAGLUTIDE (2 MG/DOSE) 8 MG/3ML ~~LOC~~ SOPN: 2.0000 mg | PEN_INJECTOR | SUBCUTANEOUS | 1 refills | Status: DC

## 2022-04-30 ENCOUNTER — Telehealth: Payer: Self-pay | Admitting: Family Medicine

## 2022-04-30 ENCOUNTER — Other Ambulatory Visit: Payer: Self-pay | Admitting: *Deleted

## 2022-04-30 MED ORDER — SEMAGLUTIDE(0.25 OR 0.5MG/DOS) 2 MG/1.5ML ~~LOC~~ SOPN: 2.5000 mg | PEN_INJECTOR | SUBCUTANEOUS | 1 refills | Status: DC

## 2022-04-30 NOTE — Telephone Encounter (Signed)
Last rx was for 2.'5mg'$  once a week per last rx.  They wanted to verify.  Rx verified.

## 2022-04-30 NOTE — Telephone Encounter (Signed)
Pharmacy called stating there was a discrepancy with the semaglutide medication that was sent to them. They would like a call back at [phone] to correct it. Please advise.

## 2022-06-12 ENCOUNTER — Encounter: Payer: Self-pay | Admitting: Family Medicine

## 2022-06-12 ENCOUNTER — Other Ambulatory Visit: Payer: Self-pay | Admitting: Family Medicine

## 2022-06-12 MED ORDER — TIRZEPATIDE 15 MG/0.5ML ~~LOC~~ SOAJ: 15.0000 mg | SUBCUTANEOUS | 1 refills | Status: DC

## 2022-06-12 NOTE — Addendum Note (Signed)
Addended by: Sharon Seller B on: 06/12/2022 02:14 PM   Modules accepted: Orders

## 2022-06-13 ENCOUNTER — Telehealth: Payer: Self-pay | Admitting: Family Medicine

## 2022-06-13 MED ORDER — NONFORMULARY OR COMPOUNDED ITEM: 0 refills | Status: DC

## 2022-06-13 MED ORDER — NONFORMULARY OR COMPOUNDED ITEM: 1 refills | Status: DC

## 2022-06-13 NOTE — Telephone Encounter (Signed)
Rx sent for you let me know if need to cancel it.

## 2022-06-13 NOTE — Telephone Encounter (Signed)
Pharmacy called to request that we write Tirzeptide 10 mg // Pyridoxine 10 mg/ml somewhere on the prescription. She said the sig and quantity is right. Please resend to [facility]. Callback number [phone]

## 2022-06-17 ENCOUNTER — Other Ambulatory Visit: Payer: Self-pay | Admitting: Family Medicine

## 2022-06-17 MED ORDER — TIRZEPATIDE-WEIGHT MANAGEMENT 12.5 MG/0.5ML ~~LOC~~ SOAJ: 12.5000 mg | SUBCUTANEOUS | 1 refills | Status: DC

## 2022-06-27 DIAGNOSIS — D239 Other benign neoplasm of skin, unspecified: Secondary | ICD-10-CM | POA: Diagnosis not present

## 2022-06-27 DIAGNOSIS — L821 Other seborrheic keratosis: Secondary | ICD-10-CM | POA: Diagnosis not present

## 2022-06-27 DIAGNOSIS — L814 Other melanin hyperpigmentation: Secondary | ICD-10-CM | POA: Diagnosis not present

## 2022-06-27 DIAGNOSIS — D1801 Hemangioma of skin and subcutaneous tissue: Secondary | ICD-10-CM | POA: Diagnosis not present

## 2022-06-27 DIAGNOSIS — L820 Inflamed seborrheic keratosis: Secondary | ICD-10-CM | POA: Diagnosis not present

## 2022-06-27 DIAGNOSIS — L570 Actinic keratosis: Secondary | ICD-10-CM | POA: Diagnosis not present

## 2022-07-10 DIAGNOSIS — M25512 Pain in left shoulder: Secondary | ICD-10-CM | POA: Diagnosis not present

## 2022-07-10 DIAGNOSIS — M25511 Pain in right shoulder: Secondary | ICD-10-CM | POA: Diagnosis not present

## 2022-08-21 ENCOUNTER — Encounter: Payer: Self-pay | Admitting: Adult Health

## 2022-08-22 ENCOUNTER — Other Ambulatory Visit: Payer: Self-pay | Admitting: Neurology

## 2022-08-22 ENCOUNTER — Encounter: Payer: Self-pay | Admitting: Family Medicine

## 2022-08-22 MED ORDER — PROPRANOLOL HCL ER 80 MG PO CP24: 80.0000 mg | ORAL_CAPSULE | Freq: Every day | ORAL | 0 refills | Status: DC

## 2022-09-03 ENCOUNTER — Ambulatory Visit (INDEPENDENT_AMBULATORY_CARE_PROVIDER_SITE_OTHER): Payer: BC Managed Care – PPO | Admitting: Family Medicine

## 2022-09-03 ENCOUNTER — Encounter: Payer: Self-pay | Admitting: Family Medicine

## 2022-09-03 VITALS — BP 120/70 | HR 68 | Temp 98.0°F | Ht 67.0 in | Wt 182.0 lb

## 2022-09-03 DIAGNOSIS — N401 Enlarged prostate with lower urinary tract symptoms: Secondary | ICD-10-CM | POA: Diagnosis not present

## 2022-09-03 DIAGNOSIS — N138 Other obstructive and reflux uropathy: Secondary | ICD-10-CM | POA: Insufficient documentation

## 2022-09-03 DIAGNOSIS — R3911 Hesitancy of micturition: Secondary | ICD-10-CM | POA: Diagnosis not present

## 2022-09-03 DIAGNOSIS — E669 Obesity, unspecified: Secondary | ICD-10-CM

## 2022-09-03 MED ORDER — SEMAGLUTIDE-WEIGHT MANAGEMENT 0.5 MG/0.5ML ~~LOC~~ SOAJ: 0.5000 mg | SUBCUTANEOUS | 0 refills | Status: AC

## 2022-09-03 MED ORDER — SEMAGLUTIDE-WEIGHT MANAGEMENT 1.7 MG/0.75ML ~~LOC~~ SOAJ: 1.7000 mg | SUBCUTANEOUS | 0 refills | Status: DC

## 2022-09-03 MED ORDER — PROPRANOLOL HCL ER 80 MG PO CP24: 80.0000 mg | ORAL_CAPSULE | Freq: Every day | ORAL | 3 refills | Status: DC

## 2022-09-03 MED ORDER — SEMAGLUTIDE-WEIGHT MANAGEMENT 1 MG/0.5ML ~~LOC~~ SOAJ: 1.0000 mg | SUBCUTANEOUS | 0 refills | Status: AC

## 2022-09-03 MED ORDER — SEMAGLUTIDE-WEIGHT MANAGEMENT 0.25 MG/0.5ML ~~LOC~~ SOAJ: 0.2500 mg | SUBCUTANEOUS | 0 refills | Status: AC

## 2022-09-03 MED ORDER — TAMSULOSIN HCL 0.4 MG PO CAPS
0.4000 mg | ORAL_CAPSULE | Freq: Every day | ORAL | 3 refills | Status: DC
Start: 2022-09-03 — End: 2022-11-27

## 2022-09-03 MED ORDER — SEMAGLUTIDE-WEIGHT MANAGEMENT 2.4 MG/0.75ML ~~LOC~~ SOAJ: 2.4000 mg | SUBCUTANEOUS | 2 refills | Status: DC

## 2022-09-03 NOTE — Progress Notes (Signed)
Chief Complaint  Patient presents with   prostate    Discuss refilling propranolol  Discuss Atorvastatin    Subjective: Patient is a 57 y.o. male here for freq urination.  Over the past 5 years, the patient has had increased urinary hesitancy, retention, frequency, nocturia, and slow stream.  It is gotten worse over the past couple months.  He was prescribed a medicine when he was 51, 5 years ago, that did help with his flow but he did not feel he needed to continue taking it.  He is waking up 3-4 times in the middle of the night and taking 15 minutes to urinate at times.  He is not currently taking any medication.  He denies any bleeding, constipation, discharge, nausea, vomiting, dysuria, or abdominal pain.  He does not consume alcohol and rarely consumes caffeine.  Patient has a history of obesity.  He has lost 50 pounds on semaglutide.  He is using the compounded version of it through a pharmacy who claims that products are FDA approved.  He has heard the news about the FDA warnings for compounded semaglutide.  He has never had any issues with this medication.  He has been off of it for 5 weeks and is interested in going back on it.  Diet is much better in addition to cravings decreased.  Past Medical History:  Diagnosis Date   Hypertension    Post traumatic stress disorder    Sleep apnea    Stroke [facility])    TIA 2021    Objective: BP 120/70 (BP Location: Left Arm, Patient Position: Sitting, Cuff Size: Normal)   Pulse 68   Temp 98 F (36.7 C) (Oral)   Ht 5\' 7"  (1.702 m)   Wt 182 lb (82.6 kg)   SpO2 98%   BMI 28.51 kg/m  General: Awake, appears stated age Heart: RRR, no LE edema Lungs: CTAB, no rales, wheezes or rhonchi. No accessory muscle use Abdomen: Bowel sounds present, soft, nontender, nondistended Psych: Age appropriate judgment and insight, normal affect and mood  Assessment and Plan: Benign prostatic hyperplasia with urinary hesitancy - Plan: tamsulosin (FLOMAX) 0.4  MG CAPS capsule  Obesity (BMI 30-39.9)  Chronic, uncontrolled.  Start Flomax 0.4 mg daily.  Follow-up in 1 month to recheck. Much improved.  We did discuss the risks and benefits of compounded semaglutide and he is willing to continue taking it.  We will start over at the 0.25 mg dosage and work her way up to 2.4 mg weekly.  Counseled on diet and exercise. The patient voiced understanding and agreement to the plan.  Jilda Roche [place], DO 09/03/22  4:24 PM

## 2022-09-03 NOTE — Patient Instructions (Signed)
Keep the diet clean and stay active.  Let us know if you need anything. 

## 2022-09-07 ENCOUNTER — Emergency Department (HOSPITAL_BASED_OUTPATIENT_CLINIC_OR_DEPARTMENT_OTHER)
Admission: EM | Admit: 2022-09-07 | Discharge: 2022-09-07 | Disposition: A | Discharge status: Home / Self Care | Payer: No Typology Code available for payment source | Attending: Emergency Medicine | Admitting: Emergency Medicine

## 2022-09-07 ENCOUNTER — Encounter (HOSPITAL_BASED_OUTPATIENT_CLINIC_OR_DEPARTMENT_OTHER): Payer: Self-pay | Admitting: Emergency Medicine

## 2022-09-07 ENCOUNTER — Other Ambulatory Visit: Payer: Self-pay

## 2022-09-07 ENCOUNTER — Emergency Department (HOSPITAL_BASED_OUTPATIENT_CLINIC_OR_DEPARTMENT_OTHER): Payer: No Typology Code available for payment source

## 2022-09-07 DIAGNOSIS — Z23 Encounter for immunization: Secondary | ICD-10-CM | POA: Insufficient documentation

## 2022-09-07 DIAGNOSIS — Z7982 Long term (current) use of aspirin: Secondary | ICD-10-CM | POA: Diagnosis not present

## 2022-09-07 DIAGNOSIS — S60022A Contusion of left index finger without damage to nail, initial encounter: Secondary | ICD-10-CM | POA: Insufficient documentation

## 2022-09-07 DIAGNOSIS — S6992XA Unspecified injury of left wrist, hand and finger(s), initial encounter: Secondary | ICD-10-CM | POA: Diagnosis not present

## 2022-09-07 DIAGNOSIS — W278XXA Contact with other nonpowered hand tool, initial encounter: Secondary | ICD-10-CM | POA: Insufficient documentation

## 2022-09-07 MED ORDER — IBUPROFEN 800 MG PO TABS
800.0000 mg | ORAL_TABLET | Freq: Once | ORAL | Status: AC
  Administered 2022-09-07: 800 mg via ORAL
  Filled 2022-09-07: qty 1

## 2022-09-07 MED ORDER — IBUPROFEN 800 MG PO TABS: 800.0000 mg | ORAL_TABLET | Freq: Three times a day (TID) | ORAL | 0 refills | Status: DC

## 2022-09-07 MED ORDER — OXYCODONE-ACETAMINOPHEN 5-325 MG PO TABS
1.0000 | ORAL_TABLET | Freq: Once | ORAL | Status: DC
  Filled 2022-09-07: qty 1

## 2022-09-07 MED ORDER — TETANUS-DIPHTH-ACELL PERTUSSIS 5-2.5-18.5 LF-MCG/0.5 IM SUSY
0.5000 mL | PREFILLED_SYRINGE | Freq: Once | INTRAMUSCULAR | Status: AC
  Administered 2022-09-07: 0.5 mL via INTRAMUSCULAR
  Filled 2022-09-07: qty 0.5

## 2022-09-07 MED ORDER — SULFAMETHOXAZOLE-TRIMETHOPRIM 800-160 MG PO TABS: 1.0000 | ORAL_TABLET | Freq: Two times a day (BID) | ORAL | 0 refills | Status: AC

## 2022-09-07 MED ORDER — ONDANSETRON 4 MG PO TBDP
4.0000 mg | ORAL_TABLET | Freq: Once | ORAL | Status: DC
  Filled 2022-09-07: qty 1

## 2022-09-07 MED ORDER — SULFAMETHOXAZOLE-TRIMETHOPRIM 800-160 MG PO TABS
1.0000 | ORAL_TABLET | Freq: Once | ORAL | Status: AC
  Administered 2022-09-07: 1 via ORAL
  Filled 2022-09-07: qty 1

## 2022-09-07 NOTE — ED Triage Notes (Signed)
Pt hit LT index finger with hammer; bleeding around nailbed

## 2022-09-07 NOTE — ED Provider Notes (Signed)
[place] [facility] AT [facility] Provider Note   CSN: [id number] Arrival date & time: 09/07/22  1742     History  Chief Complaint  Patient presents with   Finger Injury    Ryan Middleton is a 57 y.o. male history of BPH here presenting with left finger injury.  He states that he was doing a project and was using a hammer accidentally hammered his left index finger.  He states that he had some bleeding around the nail afterwards.  No meds prior to arrival.  He states that he is not up-to-date with tetanus.  The history is provided by the patient.       Home Medications Prior to Admission medications   Medication Sig Start Date End Date Taking? Authorizing Provider  aspirin EC 325 MG tablet Take 1 tablet (325 mg total) by mouth daily. 04/21/21   Huel Cote, MD  atorvastatin (LIPITOR) 80 MG tablet Take 1 tablet (80 mg total) by mouth daily. 02/25/22   Sharlene Dory, DO  buPROPion (WELLBUTRIN XL) 150 MG 24 hr tablet Take 1 tablet (150 mg total) by mouth daily. 03/26/22   Sharlene Dory, DO  NONFORMULARY OR COMPOUNDED ITEM Tirzepatide 10mg / Pyridoxine 10mg  Inject 15mg  subq weekly 2ml 06/13/22   Wendling, Jilda Roche, DO  propranolol ER (INDERAL LA) 80 MG 24 hr capsule Take 1 capsule (80 mg total) by mouth daily. 09/03/22   Sharlene Dory, DO  QUEtiapine (SEROQUEL) 100 MG tablet Take 1 tablet (100 mg total) by mouth at bedtime. 03/11/22   Sharlene Dory, DO  Semaglutide,0.25 or 0.5MG /DOS, 2 MG/1.5ML SOPN Inject 2.5 mg into the skin once a week. 04/30/22   Sharlene Dory, DO  Semaglutide-Weight Management 0.25 MG/0.5ML SOAJ Inject 0.25 mg into the skin once a week for 28 days. 09/03/22 10/01/22  Sharlene Dory, DO  Semaglutide-Weight Management 0.5 MG/0.5ML SOAJ Inject 0.5 mg into the skin once a week for 28 days. 10/02/22 10/30/22  Sharlene Dory, DO  Semaglutide-Weight Management 1 MG/0.5ML SOAJ  Inject 1 mg into the skin once a week for 28 days. 10/31/22 11/28/22  Sharlene Dory, DO  Semaglutide-Weight Management 1.7 MG/0.75ML SOAJ Inject 1.7 mg into the skin once a week for 28 days. 11/29/22 12/27/22  Sharlene Dory, DO  Semaglutide-Weight Management 2.4 MG/0.75ML SOAJ Inject 2.4 mg into the skin once a week. 12/28/22   Sharlene Dory, DO  tamsulosin (FLOMAX) 0.4 MG CAPS capsule Take 1 capsule (0.4 mg total) by mouth daily. 09/03/22   Sharlene Dory, DO      Allergies    Hydrocodone, Oxycodone, and Simvastatin    Review of Systems   Review of Systems  Musculoskeletal:        Left index finger pain  All other systems reviewed and are negative.   Physical Exam Updated Vital Signs BP 122/87 (BP Location: Right Arm)   Pulse 74   Temp 98.6 F (37 C)   Resp 18   Ht 5\' 7"  (1.702 m)   Wt 77.1 kg   SpO2 98%   BMI 26.63 kg/m  Physical Exam Vitals and nursing note reviewed.  Constitutional:      Appearance: Normal appearance.  HENT:     Head: Normocephalic.     Nose: Nose normal.     Mouth/Throat:     Mouth: Mucous membranes are moist.  Eyes:     Pupils: Pupils are equal, round, and reactive to light.  Cardiovascular:  Rate and Rhythm: Normal rate.  Pulmonary:     Effort: Pulmonary effort is normal.  Abdominal:     General: Abdomen is flat.  Musculoskeletal:     Cervical back: Normal range of motion.     Comments: Left index finger with a nail intact.  He has a small laceration on the tip of the finger.  There is no obvious subungual hematoma.  Laceration did not extend to the nail bed.  Patient is able to flex and extend the DIP joint.  Patient has slightly diminished capillary refill.  Neurological:     General: No focal deficit present.     Mental Status: He is alert.  Psychiatric:        Mood and Affect: Mood normal.     ED Results / Procedures / Treatments   Labs (all labs ordered are listed, but only abnormal results are  displayed) Labs Reviewed - No data to display  EKG None  Radiology DG Finger Index Left  Result Date: 09/07/2022 CLINICAL DATA:  Finger injury EXAM: LEFT INDEX FINGER 3V COMPARISON:  None Available. FINDINGS: There is no evidence of fracture or dislocation. There is no evidence of arthropathy or other focal bone abnormality. Soft tissues are unremarkable. IMPRESSION: No evidence of fracture. Electronically Signed   By: Allegra Lai M.D.   On: 09/07/2022 18:59    Procedures Procedures    Medications Ordered in ED Medications  sulfamethoxazole-trimethoprim (BACTRIM DS) 800-160 MG per tablet 1 tablet (has no administration in time range)  ibuprofen (ADVIL) tablet 800 mg (has no administration in time range)  Tdap (BOOSTRIX) injection 0.5 mL (has no administration in time range)    ED Course/ Medical Decision Making/ A&P                             Medical Decision Making Ryan Middleton is a 57 y.o. male here presenting with left index finger injury.  X-ray did not show any fracture.  Patient does not have any subungual hematoma.  He has a small laceration the tip of the finger but I do not see any obvious nailbed injury.  I think at this point, patient likely has a contusion of the finger.  Tetanus is updated we will send him home with Bactrim to prevent infection   Problems Addressed: Contusion of left index finger without damage to nail, initial encounter: acute illness or injury  Amount and/or Complexity of Data Reviewed Radiology: ordered and independent interpretation performed. Decision-making details documented in ED Course.  Risk Prescription drug management.    Final Clinical Impression(s) / ED Diagnoses Final diagnoses:  None    Rx / DC Orders ED Discharge Orders     None         Charlynne Pander, MD 09/07/22 [phone]

## 2022-09-07 NOTE — Discharge Instructions (Signed)
You likely have a finger contusion.  You have a small laceration on the finger and given that it is a dirty wound, I recommend leaving it open and take Bactrim twice a day for a week to prevent infection  Your tetanus was updated  Take Motrin as needed for pain  Keep the wound clean and dry  See hand surgery for follow-up if you have worsening pain or purulent discharge.  You may notice that your fingernail turn darker  Return to ER if you have worsening pain or purulent discharge or fever

## 2022-09-25 ENCOUNTER — Ambulatory Visit (INDEPENDENT_AMBULATORY_CARE_PROVIDER_SITE_OTHER): Payer: BC Managed Care – PPO | Admitting: Family Medicine

## 2022-09-25 ENCOUNTER — Encounter: Payer: Self-pay | Admitting: Family Medicine

## 2022-09-25 VITALS — BP 110/82 | HR 80 | Temp 97.7°F | Ht 67.0 in | Wt 173.0 lb

## 2022-09-25 DIAGNOSIS — Z125 Encounter for screening for malignant neoplasm of prostate: Secondary | ICD-10-CM | POA: Diagnosis not present

## 2022-09-25 DIAGNOSIS — Z0000 Encounter for general adult medical examination without abnormal findings: Secondary | ICD-10-CM | POA: Diagnosis not present

## 2022-09-25 NOTE — Progress Notes (Signed)
Chief Complaint  Patient presents with   Annual Exam    Well Male Ryan Middleton is here for a complete physical.   His last physical was >1 year ago.  Current diet: in general, a "healthy" diet.  Current exercise: walking Weight trend: stable Fatigue out of ordinary? No. Seat belt? Yes.   Advanced directive? Yes  Health maintenance Shingrix- Yes Colonoscopy- Yes Tetanus- Yes HIV- Yes Hep C- Yes   Past Medical History:  Diagnosis Date   Hypertension    Post traumatic stress disorder    Sleep apnea    Stroke [facility])    TIA 2021      Past Surgical History:  Procedure Laterality Date   APPENDECTOMY     DISTAL BICEPS TENDON REPAIR Right 04/20/2021   Procedure: RIGHT DISTAL BICEPS REPAIR;  Surgeon: Huel Cote, MD;  Location: [facility] OR;  Service: Orthopedics;  Laterality: Right;   FOOT SURGERY Bilateral    plantar fascitis    Medications  Current Outpatient Medications on File Prior to Visit  Medication Sig Dispense Refill   aspirin EC 325 MG tablet Take 1 tablet (325 mg total) by mouth daily. 30 tablet 0   atorvastatin (LIPITOR) 80 MG tablet Take 1 tablet (80 mg total) by mouth daily. 90 tablet 3   buPROPion (WELLBUTRIN XL) 150 MG 24 hr tablet Take 1 tablet (150 mg total) by mouth daily. 90 tablet 2   ibuprofen (ADVIL) 800 MG tablet Take 1 tablet (800 mg total) by mouth 3 (three) times daily. 21 tablet 0   NONFORMULARY OR COMPOUNDED ITEM Tirzepatide 10mg / Pyridoxine 10mg  Inject 15mg  subq weekly 2ml 2 each 0   propranolol ER (INDERAL LA) 80 MG 24 hr capsule Take 1 capsule (80 mg total) by mouth daily. 90 capsule 3   QUEtiapine (SEROQUEL) 100 MG tablet Take 1 tablet (100 mg total) by mouth at bedtime. 90 tablet 2   Semaglutide,0.25 or 0.5MG /DOS, 2 MG/1.5ML SOPN Inject 2.5 mg into the skin once a week. 2 mL 1   Semaglutide-Weight Management 0.25 MG/0.5ML SOAJ Inject 0.25 mg into the skin once a week for 28 days. 2 mL 0   [START ON 10/02/2022] Semaglutide-Weight  Management 0.5 MG/0.5ML SOAJ Inject 0.5 mg into the skin once a week for 28 days. 2 mL 0   [START ON 10/31/2022] Semaglutide-Weight Management 1 MG/0.5ML SOAJ Inject 1 mg into the skin once a week for 28 days. 2 mL 0   [START ON 11/29/2022] Semaglutide-Weight Management 1.7 MG/0.75ML SOAJ Inject 1.7 mg into the skin once a week for 28 days. 3 mL 0   [START ON 12/28/2022] Semaglutide-Weight Management 2.4 MG/0.75ML SOAJ Inject 2.4 mg into the skin once a week. 3 mL 2   tamsulosin (FLOMAX) 0.4 MG CAPS capsule Take 1 capsule (0.4 mg total) by mouth daily. 30 capsule 3    Allergies Allergies  Allergen Reactions   Hydrocodone Nausea And Vomiting   Oxycodone Nausea Only   Simvastatin Rash    Family History Family History  Adopted: Yes    Review of Systems: Constitutional:  no fevers Eye:  no recent significant change in vision Ear/Nose/Mouth/Throat:  Ears:  no hearing loss Nose/Mouth/Throat:  no complaints of nasal congestion, no sore throat Cardiovascular:  no chest pain Respiratory:  no shortness of breath Gastrointestinal:  no change in bowel habits GU:  Male: negative for dysuria, frequency Musculoskeletal/Extremities:  no joint pain Integumentary (Skin/Breast):  no abnormal skin lesions reported Neurologic:  no headaches Endocrine: No unexpected weight changes  Hematologic/Lymphatic:  no abnormal bleeding  Exam BP 110/82 (BP Location: Left Arm, Patient Position: Sitting, Cuff Size: Normal)   Pulse 80   Temp 97.7 F (36.5 C) (Oral)   Ht 5\' 7"  (1.702 m)   Wt 173 lb (78.5 kg)   SpO2 98%   BMI 27.10 kg/m  General:  well developed, well nourished, in no apparent distress Skin:  no significant moles, warts, or growths Head:  no masses, lesions, or tenderness Eyes:  pupils equal and round, sclera anicteric without injection Ears:  canals without lesions, TMs shiny without retraction, no obvious effusion, no erythema Nose:  nares patent, mucosa normal Throat/Pharynx:  lips and  gingiva without lesion; tongue and uvula midline; non-inflamed pharynx; no exudates or postnasal drainage Neck: neck supple without adenopathy, thyromegaly, or masses Cardiac: RRR, no bruits, no LE edema Lungs:  clear to auscultation, breath sounds equal bilaterally, no respiratory distress Abdomen: BS+, soft, non-tender, non-distended, no masses or organomegaly noted Rectal: Deferred Musculoskeletal:  symmetrical muscle groups noted without atrophy or deformity Neuro:  gait normal; deep tendon reflexes normal and symmetric Psych: well oriented with normal range of affect and appropriate judgment/insight  Assessment and Plan  Well adult exam - Plan: CBC, Comprehensive metabolic panel, Lipid panel  Screening for prostate cancer - Plan: PSA   Well 57 y.o. male. Counseled on diet and exercise. Counseled on risks and benefits of prostate cancer screening with PSA. The patient agrees to undergo testing. Immunizations, labs, and further orders as above. Advanced directive form requested today.  Follow up in 6 mo. The patient voiced understanding and agreement to the plan.  Jilda Roche [place], DO 09/25/22 3:35 PM

## 2022-09-25 NOTE — Patient Instructions (Signed)
Give us 2-3 business days to get the results of your labs back.   Keep the diet clean and stay active.  Please get me a copy of your advanced directive form at your convenience.   Let us know if you need anything.  

## 2022-09-26 ENCOUNTER — Other Ambulatory Visit: Payer: Self-pay | Admitting: Family Medicine

## 2022-09-26 DIAGNOSIS — R972 Elevated prostate specific antigen [PSA]: Secondary | ICD-10-CM

## 2022-09-26 LAB — LIPID PANEL
Cholesterol: 92 mg/dL (ref 0–200)
HDL: 38.4 mg/dL — ABNORMAL LOW (ref >39.00)
LDL Cholesterol: 37 mg/dL (ref 0–99)
NonHDL: 53.45
Total CHOL/HDL Ratio: 2
Triglycerides: 82 mg/dL (ref 0.0–149.0)
VLDL: 16.4 mg/dL (ref 0.0–40.0)

## 2022-09-26 LAB — COMPREHENSIVE METABOLIC PANEL
ALT: 27 U/L (ref 0–53)
AST: 16 U/L (ref 0–37)
Albumin: 4.3 g/dL (ref 3.5–5.2)
Alkaline Phosphatase: 80 U/L (ref 39–117)
BUN: 11 mg/dL (ref 6–23)
CO2: 30 mEq/L (ref 19–32)
Calcium: 10.4 mg/dL (ref 8.4–10.5)
Chloride: 105 mEq/L (ref 96–112)
Creatinine, Ser: 1.1 mg/dL (ref 0.40–1.50)
GFR: 75.43 mL/min (ref >60.00)
Glucose, Bld: 75 mg/dL (ref 70–99)
Potassium: 4.6 mEq/L (ref 3.5–5.1)
Sodium: 142 mEq/L (ref 135–145)
Total Bilirubin: 1.3 mg/dL — ABNORMAL HIGH (ref 0.2–1.2)
Total Protein: 6.5 g/dL (ref 6.0–8.3)

## 2022-09-26 LAB — CBC
HCT: 46.3 % (ref 39.0–52.0)
Hemoglobin: 16.2 g/dL (ref 13.0–17.0)
MCHC: 34.9 g/dL (ref 30.0–36.0)
MCV: 98.5 fl (ref 78.0–100.0)
Platelets: 228 10*3/uL (ref 150.0–400.0)
RBC: 4.7 Mil/uL (ref 4.22–5.81)
RDW: 14.6 % (ref 11.5–15.5)
WBC: 10.9 10*3/uL — ABNORMAL HIGH (ref 4.0–10.5)

## 2022-09-26 LAB — PSA: PSA: 4.04 ng/mL — ABNORMAL HIGH (ref 0.10–4.00)

## 2022-09-26 NOTE — Addendum Note (Signed)
Addended by: Scharlene Gloss B on: 09/26/2022 12:07 PM   Modules accepted: Orders

## 2022-10-03 ENCOUNTER — Encounter: Payer: Self-pay | Admitting: Family Medicine

## 2022-10-04 ENCOUNTER — Ambulatory Visit: Payer: BC Managed Care – PPO | Admitting: Family Medicine

## 2022-11-08 ENCOUNTER — Other Ambulatory Visit (INDEPENDENT_AMBULATORY_CARE_PROVIDER_SITE_OTHER): Payer: BC Managed Care – PPO

## 2022-11-08 DIAGNOSIS — R972 Elevated prostate specific antigen [PSA]: Secondary | ICD-10-CM | POA: Diagnosis not present

## 2022-11-08 NOTE — Addendum Note (Signed)
Addended by: Mervin Kung A on: 11/08/2022 03:05 PM   Modules accepted: Orders

## 2022-11-09 ENCOUNTER — Other Ambulatory Visit: Payer: Self-pay | Admitting: Family Medicine

## 2022-11-09 DIAGNOSIS — R972 Elevated prostate specific antigen [PSA]: Secondary | ICD-10-CM

## 2022-11-09 LAB — PSA: PSA: 4.56 ng/mL — ABNORMAL HIGH (ref <=4.00)

## 2022-11-11 DIAGNOSIS — M25512 Pain in left shoulder: Secondary | ICD-10-CM | POA: Diagnosis not present

## 2022-11-11 DIAGNOSIS — M25511 Pain in right shoulder: Secondary | ICD-10-CM | POA: Diagnosis not present

## 2022-11-20 ENCOUNTER — Ambulatory Visit: Payer: BC Managed Care – PPO | Admitting: Urology

## 2022-11-27 ENCOUNTER — Ambulatory Visit (INDEPENDENT_AMBULATORY_CARE_PROVIDER_SITE_OTHER): Payer: BC Managed Care – PPO | Admitting: Urology

## 2022-11-27 ENCOUNTER — Encounter: Payer: Self-pay | Admitting: Urology

## 2022-11-27 ENCOUNTER — Other Ambulatory Visit: Payer: Self-pay | Admitting: Family Medicine

## 2022-11-27 VITALS — BP 135/90 | HR 75 | Ht 67.0 in | Wt 171.0 lb

## 2022-11-27 DIAGNOSIS — N138 Other obstructive and reflux uropathy: Secondary | ICD-10-CM | POA: Diagnosis not present

## 2022-11-27 DIAGNOSIS — N401 Enlarged prostate with lower urinary tract symptoms: Secondary | ICD-10-CM | POA: Diagnosis not present

## 2022-11-27 DIAGNOSIS — R972 Elevated prostate specific antigen [PSA]: Secondary | ICD-10-CM | POA: Diagnosis not present

## 2022-11-27 LAB — URINALYSIS, ROUTINE W REFLEX MICROSCOPIC
Bilirubin, UA: NEGATIVE
Glucose, UA: NEGATIVE
Ketones, UA: NEGATIVE
Leukocytes,UA: NEGATIVE
Nitrite, UA: NEGATIVE
Protein,UA: NEGATIVE
RBC, UA: NEGATIVE
Specific Gravity, UA: >1.03 — ABNORMAL HIGH (ref 1.005–1.030)
Urobilinogen, Ur: 0.2 mg/dL (ref 0.2–1.0)
pH, UA: 5.5 (ref 5.0–7.5)

## 2022-11-27 NOTE — Progress Notes (Signed)
Assessment: 1. Elevated PSA; negative biopsy 6/20   2. BPH with obstruction/lower urinary tract symptoms     Plan: I personally reviewed the patient's chart including provider notes, and lab results. Today I had a long discussion with the patient regarding PSA and the rationale and controversies of prostate cancer early detection.  I discussed the pros and cons of further evaluation including TRUS and prostate Bx.  Potential adverse events and complications as well as standard instructions were given.  Patient expressed his understanding of these issues. Schedule appt for MyProstateScore2.0 in next 1-2 weeks   Chief Complaint:  Chief Complaint  Patient presents with   Elevated PSA    History of Present Illness:  Ryan Middleton is a 57 y.o. male who is seen in consultation from [place], DO for evaluation of elevated PSA.  PSA results: 4/19 3.01 6/20 4.33 4/23 2.76 5/24 4.04 7/24 4.56  He status post a prostate biopsy in June 2020 by Dr. Margo Aye.  Prostate volume measured 32 mL.  Path was negative for malignancy.  He does have some lower urinary tract symptoms including intermittent stream, decreased force of stream, and frequency.  He has been on tamsulosin for approximately 4-5 months with improvement in his symptoms.  No dysuria or gross hematuria.  No history of UTIs or prostatitis. IPSS = 15 today.    Past Medical History:  Past Medical History:  Diagnosis Date   Hypertension    Post traumatic stress disorder    Sleep apnea    Stroke [facility])    TIA 2021    Past Surgical History:  Past Surgical History:  Procedure Laterality Date   APPENDECTOMY     DISTAL BICEPS TENDON REPAIR Right 04/20/2021   Procedure: RIGHT DISTAL BICEPS REPAIR;  Surgeon: Huel Cote, MD;  Location: [facility] OR;  Service: Orthopedics;  Laterality: Right;   FOOT SURGERY Bilateral    plantar fascitis    Allergies:  Allergies  Allergen Reactions   Hydrocodone Nausea And  Vomiting   Oxycodone Nausea Only   Simvastatin Rash    Family History:  Family History  Adopted: Yes    Social History:  Social History   Tobacco Use   Smoking status: Every Day    Current packs/day: 0.50    Average packs/day: 0.5 packs/day for 30.0 years (15.0 ttl pk-yrs)    Types: Cigarettes   Smokeless tobacco: Never  Vaping Use   Vaping status: Never Used  Substance Use Topics   Alcohol use: Not Currently    Comment: occ   Drug use: No    Review of symptoms:  Constitutional:  Negative for unexplained weight loss, night sweats, fever, chills ENT:  Negative for nose bleeds, sinus pain, painful swallowing CV:  Negative for chest pain, shortness of breath, exercise intolerance, palpitations, loss of consciousness Resp:  Negative for cough, wheezing, shortness of breath GI:  Negative for nausea, vomiting, diarrhea, bloody stools GU:  Positives noted in HPI; otherwise negative for gross hematuria, dysuria, urinary incontinence Neuro:  Negative for seizures, poor balance, limb weakness, slurred speech Psych:  Negative for lack of energy, depression, anxiety Endocrine:  Negative for polydipsia, polyuria, symptoms of hypoglycemia (dizziness, hunger, sweating) Hematologic:  Negative for anemia, purpura, petechia, prolonged or excessive bleeding, use of anticoagulants  Allergic:  Negative for difficulty breathing or choking as a result of exposure to anything; no shellfish allergy; no allergic response (rash/itch) to materials, foods  Physical exam: BP (!) 135/90   Pulse 75   Ht  5\' 7"  (1.702 m)   Wt 171 lb (77.6 kg)   BMI 26.78 kg/m  GENERAL APPEARANCE:  Well appearing, well developed, well nourished, NAD HEENT: Atraumatic, Normocephalic, oropharynx clear. NECK: Supple without lymphadenopathy or thyromegaly. LUNGS: Clear to auscultation bilaterally. HEART: Regular Rate and Rhythm without murmurs, gallops, or rubs. ABDOMEN: Soft, non-tender, No Masses. EXTREMITIES: Moves  all extremities well.  Without clubbing, cyanosis, or edema. NEUROLOGIC:  Alert and oriented x 3, normal gait, CN II-XII grossly intact.  MENTAL STATUS:  Appropriate. BACK:  Non-tender to palpation.  No CVAT SKIN:  Warm, dry and intact.   GU: Penis:  uncircumcised Meatus: Normal Scrotum: normal, no masses Testis: normal without masses bilateral Epididymis: normal Prostate: 40 g, NT, no nodules Rectum: Normal tone,  no masses or tenderness   Results: U/A: negative

## 2022-12-04 ENCOUNTER — Encounter (INDEPENDENT_AMBULATORY_CARE_PROVIDER_SITE_OTHER): Payer: Self-pay

## 2022-12-17 ENCOUNTER — Encounter: Payer: Self-pay | Admitting: Family Medicine

## 2022-12-18 ENCOUNTER — Encounter: Payer: Self-pay | Admitting: Urology

## 2022-12-18 ENCOUNTER — Ambulatory Visit (INDEPENDENT_AMBULATORY_CARE_PROVIDER_SITE_OTHER): Payer: BC Managed Care – PPO | Admitting: Urology

## 2022-12-18 VITALS — BP 141/87 | HR 89 | Ht 67.0 in | Wt 172.0 lb

## 2022-12-18 DIAGNOSIS — N401 Enlarged prostate with lower urinary tract symptoms: Secondary | ICD-10-CM | POA: Diagnosis not present

## 2022-12-18 DIAGNOSIS — R972 Elevated prostate specific antigen [PSA]: Secondary | ICD-10-CM | POA: Diagnosis not present

## 2022-12-18 DIAGNOSIS — N138 Other obstructive and reflux uropathy: Secondary | ICD-10-CM

## 2022-12-18 NOTE — Progress Notes (Signed)
  Assessment: 1. Elevated PSA; negative biopsy 6/20   2. BPH with obstruction/lower urinary tract symptoms     Plan: MyProstateScore2.0 sent today Will notify patient of results when available  Chief Complaint:  Chief Complaint  Patient presents with   Elevated PSA    History of Present Illness:  Ryan Middleton is a 57 y.o. male who is seen for further evaluation of elevated PSA.  PSA results: 4/19 3.01 6/20 4.33 4/23 2.76 5/24 4.04 7/24 4.56  He status post a prostate biopsy in June 2020 by Dr. Margo Aye.  Prostate volume measured 32 mL.  Path was negative for malignancy.  He does have some lower urinary tract symptoms including intermittent stream, decreased force of stream, and frequency.  He has been on tamsulosin for approximately 4-5 months with improvement in his symptoms.  No dysuria or gross hematuria.  No history of UTIs or prostatitis. IPSS = 15. DRE revealed a 40 g gland without tenderness or nodularity.  He presents today for further evaluation with MyProstateScore2.0.  Portions of the above documentation were copied from a prior visit for review purposes only.   Past Medical History:  Past Medical History:  Diagnosis Date   Hypertension    Post traumatic stress disorder    Sleep apnea    Stroke [facility])    TIA 2021    Past Surgical History:  Past Surgical History:  Procedure Laterality Date   APPENDECTOMY     DISTAL BICEPS TENDON REPAIR Right 04/20/2021   Procedure: RIGHT DISTAL BICEPS REPAIR;  Surgeon: Huel Cote, MD;  Location: [facility] OR;  Service: Orthopedics;  Laterality: Right;   FOOT SURGERY Bilateral    plantar fascitis    Allergies:  Allergies  Allergen Reactions   Hydrocodone Nausea And Vomiting   Oxycodone Nausea Only   Simvastatin Rash    Family History:  Family History  Adopted: Yes    Social History:  Social History   Tobacco Use   Smoking status: Every Day    Current packs/day: 0.50    Average packs/day: 0.5 packs/day  for 30.0 years (15.0 ttl pk-yrs)    Types: Cigarettes   Smokeless tobacco: Never  Vaping Use   Vaping status: Never Used  Substance Use Topics   Alcohol use: Not Currently    Comment: occ   Drug use: No    ROS: Constitutional:  Negative for fever, chills, weight loss CV: Negative for chest pain, previous MI, hypertension Respiratory:  Negative for shortness of breath, wheezing, sleep apnea, frequent cough GI:  Negative for nausea, vomiting, bloody stool, GERD  Physical exam: BP (!) 141/87   Pulse 89   Ht 5\' 7"  (1.702 m)   Wt 172 lb (78 kg)   BMI 26.94 kg/m  GU:  prostate exam done prior to obtaining urine for test  Results: None

## 2022-12-19 ENCOUNTER — Other Ambulatory Visit: Payer: Self-pay | Admitting: Family Medicine

## 2022-12-19 NOTE — Telephone Encounter (Signed)
Pharmacy called and states they faxed over a refill on 8/9 and have not heard anything back.    Semaglutide-Weight Management   [facility] [address], [place] [address] Phone: [phone]  Fax: [phone]

## 2022-12-19 NOTE — Telephone Encounter (Signed)
Pt isnt due for next refill until 8/24 , wendling has sent in extra refill for that date

## 2022-12-19 NOTE — Telephone Encounter (Signed)
Can you print " future " medication and fax to pharmacy

## 2022-12-23 ENCOUNTER — Telehealth: Payer: Self-pay | Admitting: Family Medicine

## 2022-12-23 NOTE — Telephone Encounter (Signed)
Rayna from [facility] called stated that the pt needs another prescription sent in for Semaglutide-Weight Management 1.7 MG/0.75ML SOAJ. She stated that the pt hasn't had it for 2 weeks. Please call & advise if this request can be accommodated at [phone].

## 2022-12-24 ENCOUNTER — Other Ambulatory Visit (HOSPITAL_COMMUNITY): Payer: Self-pay

## 2022-12-24 MED ORDER — SEMAGLUTIDE-WEIGHT MANAGEMENT 1.7 MG/0.75ML ~~LOC~~ SOAJ: 1.7000 mg | SUBCUTANEOUS | 0 refills | Status: DC

## 2022-12-24 NOTE — Telephone Encounter (Signed)
[facility] calling back to advise that medication Semaglutide needs to be written in a way that they compound so it should say 2.5 mg/pyridoxine 10 mg/ml SQ injection. Please call [phone] and ask for Karel Jarvis if there are any questions on making sure it's sent in correctly.

## 2022-12-24 NOTE — Addendum Note (Signed)
Addended byConrad Hocking D on: 12/24/2022 12:49 PM   Modules accepted: Orders

## 2022-12-24 NOTE — Telephone Encounter (Signed)
Rx faxed

## 2022-12-24 NOTE — Telephone Encounter (Signed)
Unsure how to do this, will have to wait for Robin and Dr. Carmelia Roller to return.

## 2022-12-25 MED ORDER — NONFORMULARY OR COMPOUNDED ITEM: 0 refills | Status: DC

## 2022-12-25 NOTE — Addendum Note (Signed)
Addended by: Scharlene Gloss B on: 12/25/2022 10:00 AM   Modules accepted: Orders

## 2022-12-25 NOTE — Telephone Encounter (Signed)
Called [facility] and spoke to [place] and she is going to fax over instructions sheet on how to correctly send this in. Gave her out fax number//attn to Zella Ball and she will send in.

## 2022-12-25 NOTE — Addendum Note (Signed)
Addended by: Scharlene Gloss B on: 12/25/2022 09:54 AM   Modules accepted: Orders

## 2022-12-27 ENCOUNTER — Encounter: Payer: Self-pay | Admitting: Family Medicine

## 2022-12-27 ENCOUNTER — Other Ambulatory Visit (HOSPITAL_BASED_OUTPATIENT_CLINIC_OR_DEPARTMENT_OTHER): Payer: Self-pay

## 2022-12-27 ENCOUNTER — Ambulatory Visit (INDEPENDENT_AMBULATORY_CARE_PROVIDER_SITE_OTHER): Payer: BC Managed Care – PPO | Admitting: Family Medicine

## 2022-12-27 VITALS — BP 120/84 | HR 71 | Temp 97.9°F | Ht 67.0 in | Wt 177.2 lb

## 2022-12-27 DIAGNOSIS — E6609 Other obesity due to excess calories: Secondary | ICD-10-CM

## 2022-12-27 DIAGNOSIS — F411 Generalized anxiety disorder: Secondary | ICD-10-CM

## 2022-12-27 MED ORDER — ZEPBOUND 2.5 MG/0.5ML ~~LOC~~ SOAJ
2.5000 mg | SUBCUTANEOUS | 0 refills | Status: AC
Start: 2022-12-27 — End: 2023-02-13
  Filled 2022-12-27 – 2023-01-14 (×2): qty 2, 28d supply, fill #0

## 2022-12-27 MED ORDER — ZEPBOUND 7.5 MG/0.5ML ~~LOC~~ SOAJ
7.5000 mg | SUBCUTANEOUS | 0 refills | Status: AC
Start: 2023-02-21 — End: 2023-03-21
  Filled 2022-12-27 – 2023-02-21 (×2): qty 2, 28d supply, fill #0

## 2022-12-27 MED ORDER — BUPROPION HCL ER (XL) 300 MG PO TB24
300.0000 mg | ORAL_TABLET | Freq: Every day | ORAL | 5 refills | Status: DC
  Filled 2022-12-27: qty 30, 30d supply, fill #0
  Filled 2023-01-21: qty 30, 30d supply, fill #1
  Filled 2023-02-23: qty 30, 30d supply, fill #2
  Filled 2023-04-07: qty 30, 30d supply, fill #3
  Filled 2023-05-04: qty 30, 30d supply, fill #4
  Filled 2023-06-03: qty 30, 30d supply, fill #5

## 2022-12-27 MED ORDER — ZEPBOUND 5 MG/0.5ML ~~LOC~~ SOAJ
5.0000 mg | SUBCUTANEOUS | 0 refills | Status: AC
Start: 2023-01-24 — End: 2023-02-21
  Filled 2022-12-27: qty 2, 28d supply, fill #0

## 2022-12-27 NOTE — Progress Notes (Signed)
Chief Complaint  Patient presents with   Follow-up    6 month     Subjective Ryan Middleton presents for f/u anxiety/depression.  Pt is currently being treated with Wellbutrin XL 150 mg daily.  Reports having more panic and irritability lately. No thoughts of harming self or others. No self-medication with alcohol, prescription drugs or illicit drugs. Pt is not following with a counselor/psychologist.  Past Medical History:  Diagnosis Date   Hypertension    Post traumatic stress disorder    Sleep apnea    Stroke [facility])    TIA 2021   Allergies as of 12/27/2022       Reactions   Hydrocodone Nausea And Vomiting   Oxycodone Nausea Only   Simvastatin Rash        Medication List        Accurate as of December 27, 2022  4:47 PM. If you have any questions, ask your nurse or doctor.          aspirin EC 325 MG tablet Take 1 tablet (325 mg total) by mouth daily.   atorvastatin 80 MG tablet Commonly known as: LIPITOR Take 1 tablet (80 mg total) by mouth daily.   buPROPion 300 MG 24 hr tablet Commonly known as: Wellbutrin XL Take 1 tablet (300 mg total) by mouth daily. What changed:  medication strength how much to take Changed by: Sharlene Dory   ibuprofen 800 MG tablet Commonly known as: ADVIL Take 1 tablet (800 mg total) by mouth 3 (three) times daily.   propranolol ER 80 MG 24 hr capsule Commonly known as: INDERAL LA Take 1 capsule (80 mg total) by mouth daily.   QUEtiapine 100 MG tablet Commonly known as: SEROQUEL Take 1 tablet (100 mg total) by mouth at bedtime.   Semaglutide-Weight Management 2.4 MG/0.75ML Soaj Inject 2.4 mg into the skin once a week. Start taking on: December 28, 2022 What changed: Another medication with the same name was removed. Continue taking this medication, and follow the directions you see here. Changed by: Sharlene Dory   tamsulosin 0.4 MG Caps capsule Commonly known as: FLOMAX Take 1 capsule (0.4 mg  total) by mouth daily.   Zepbound 2.5 MG/0.5ML Pen Generic drug: tirzepatide Inject 2.5 mg into the skin once a week for 28 days. Started by: Jilda Roche [name]   Zepbound 5 MG/0.5ML Pen Generic drug: tirzepatide Inject 5 mg into the skin once a week for 28 days. Start taking on: January 24, 2023 Started by: Jilda Roche [name]   Zepbound 7.5 MG/0.5ML Pen Generic drug: tirzepatide Inject 7.5 mg into the skin once a week for 28 days. Start taking on: February 21, 2023 Started by: Sharlene Dory        Exam BP 120/84 (BP Location: Left Arm, Patient Position: Sitting, Cuff Size: Normal)   Pulse 71   Temp 97.9 F (36.6 C) (Oral)   Ht 5\' 7"  (1.702 m)   Wt 177 lb 4 oz (80.4 kg)   SpO2 97%   BMI 27.76 kg/m  General:  well developed, well nourished, in no apparent distress Lungs:  No respiratory distress Psych: well oriented with normal range of affect and age-appropriate judgement/insight, alert and oriented x4.  Assessment and Plan  GAD (generalized anxiety disorder)  Obesity due to excess calories with serious comorbidity, unspecified classification - Plan: tirzepatide (ZEPBOUND) 2.5 MG/0.5ML Pen, tirzepatide (ZEPBOUND) 5 MG/0.5ML Pen, tirzepatide (ZEPBOUND) 7.5 MG/0.5ML Pen  Chronic, uncontrolled.  Increase Wellbutrin XL 150 mg to  300 mg daily.  Counseled on exercise. He reports his insurance will cover Zepbound so we will send that downstairs. F/u as originally scheduled for now, in 1 month if he is not doing well with the change. The patient voiced understanding and agreement to the plan.  Jilda Roche [place], DO 12/27/22 4:47 PM

## 2022-12-27 NOTE — Patient Instructions (Addendum)
Keep the diet clean and stay active.  Let me know if there are cost issues.   If things are still not improved in the next month, please return to the clinic.   Let us know if you need anything.

## 2022-12-27 NOTE — Telephone Encounter (Signed)
They were to fax over something with instructions how to do this, but as of today have not received anything from them

## 2022-12-30 ENCOUNTER — Encounter: Payer: Self-pay | Admitting: Urology

## 2022-12-30 NOTE — Telephone Encounter (Signed)
Spoke with another rep from [facility] called to advise there is no instructions sheet because the instructions would be what the provider decides as far as how often to administer the medication. The provider should send in Semaglutide 2.5 mg/B6 10 mg/ml. It appears that is all [facility] is asking for.

## 2023-01-02 ENCOUNTER — Encounter: Payer: Self-pay | Admitting: Urology

## 2023-01-08 ENCOUNTER — Telehealth: Payer: Self-pay | Admitting: Urology

## 2023-01-08 NOTE — Telephone Encounter (Signed)
From: Milderd Meager., MD Sent: 01/02/2023 11:37 PM EDT To: [name]  Please schedule f/u in 6 months.  LVM for pt to call us and sch 6 month follow up

## 2023-01-15 ENCOUNTER — Other Ambulatory Visit: Payer: Self-pay

## 2023-01-15 ENCOUNTER — Other Ambulatory Visit (HOSPITAL_BASED_OUTPATIENT_CLINIC_OR_DEPARTMENT_OTHER): Payer: Self-pay

## 2023-02-14 ENCOUNTER — Other Ambulatory Visit: Payer: Self-pay | Admitting: Family Medicine

## 2023-02-14 MED ORDER — ATORVASTATIN CALCIUM 80 MG PO TABS: 80.0000 mg | ORAL_TABLET | Freq: Every day | ORAL | 3 refills | Status: DC

## 2023-02-21 ENCOUNTER — Other Ambulatory Visit (HOSPITAL_BASED_OUTPATIENT_CLINIC_OR_DEPARTMENT_OTHER): Payer: Self-pay

## 2023-02-24 ENCOUNTER — Other Ambulatory Visit (HOSPITAL_BASED_OUTPATIENT_CLINIC_OR_DEPARTMENT_OTHER): Payer: Self-pay

## 2023-03-17 ENCOUNTER — Other Ambulatory Visit: Payer: Self-pay | Admitting: Family Medicine

## 2023-03-17 DIAGNOSIS — N401 Enlarged prostate with lower urinary tract symptoms: Secondary | ICD-10-CM

## 2023-03-17 MED ORDER — TAMSULOSIN HCL 0.4 MG PO CAPS
0.4000 mg | ORAL_CAPSULE | Freq: Every day | ORAL | 1 refills | Status: DC
Start: 2023-03-17 — End: 2023-10-02

## 2023-04-04 DIAGNOSIS — M19012 Primary osteoarthritis, left shoulder: Secondary | ICD-10-CM | POA: Diagnosis not present

## 2023-04-04 DIAGNOSIS — M19011 Primary osteoarthritis, right shoulder: Secondary | ICD-10-CM | POA: Diagnosis not present

## 2023-04-09 ENCOUNTER — Encounter: Payer: Self-pay | Admitting: Family Medicine

## 2023-04-09 ENCOUNTER — Other Ambulatory Visit: Payer: Self-pay | Admitting: Family Medicine

## 2023-04-09 ENCOUNTER — Other Ambulatory Visit (HOSPITAL_BASED_OUTPATIENT_CLINIC_OR_DEPARTMENT_OTHER): Payer: Self-pay

## 2023-04-09 MED ORDER — ZEPBOUND 12.5 MG/0.5ML ~~LOC~~ SOAJ
12.5000 mg | SUBCUTANEOUS | 0 refills | Status: AC
  Filled 2023-04-09: qty 2, 28d supply, fill #0

## 2023-04-09 MED ORDER — ZEPBOUND 10 MG/0.5ML ~~LOC~~ SOAJ
10.0000 mg | SUBCUTANEOUS | 0 refills | Status: AC
  Filled 2023-04-09 (×2): qty 2, 28d supply, fill #0

## 2023-04-09 MED ORDER — ZEPBOUND 15 MG/0.5ML ~~LOC~~ SOAJ
15.0000 mg | SUBCUTANEOUS | 3 refills | Status: DC
  Filled 2023-04-09: qty 2, 28d supply, fill #0

## 2023-04-12 ENCOUNTER — Encounter: Payer: Self-pay | Admitting: Family Medicine

## 2023-04-14 ENCOUNTER — Other Ambulatory Visit: Payer: Self-pay | Admitting: Family Medicine

## 2023-04-14 ENCOUNTER — Other Ambulatory Visit (HOSPITAL_BASED_OUTPATIENT_CLINIC_OR_DEPARTMENT_OTHER): Payer: Self-pay

## 2023-04-14 MED ORDER — QUETIAPINE FUMARATE 100 MG PO TABS
100.0000 mg | ORAL_TABLET | Freq: Every day | ORAL | 2 refills | Status: DC
  Filled 2023-04-14: qty 90, 90d supply, fill #0
  Filled 2023-08-01: qty 90, 90d supply, fill #1

## 2023-05-13 ENCOUNTER — Ambulatory Visit: Payer: BC Managed Care – PPO | Admitting: Family

## 2023-05-13 VITALS — BP 123/82 | HR 86 | Temp 98.6°F | Resp 16 | Ht 67.0 in | Wt 167.0 lb

## 2023-05-13 DIAGNOSIS — H9312 Tinnitus, left ear: Secondary | ICD-10-CM | POA: Diagnosis not present

## 2023-05-13 NOTE — Assessment & Plan Note (Signed)
  History of noise exposure and hearing loss due to [place]. New onset of high-pitched, intermittent, unilateral tinnitus in the left ear for the past six months. No associated hearing loss or pain. -Referral to ENT for further evaluation and audiology testing. -Consider hearing aids if recommended by ENT, potentially through [place] benefits. -Encourage use of white noise or similar strategies to manage symptoms.

## 2023-05-13 NOTE — Progress Notes (Signed)
 Subjective:     Patient ID: Ryan Middleton, male    DOB: 1965/07/22, 58 y.o.   MRN: [id number]  Chief Complaint  Patient presents with   Tinnitus    Patient has a history of tinnitus, new noise on left ear for about 6 months    HPI  Discussed the use of AI scribe software for clinical note transcription with the patient, who gave verbal consent to proceed.  History of Present Illness   The patient, a veteran with a history of hearing loss and tinnitus, presents with a new, distinct sound in his left ear. He describes it as a 'tinny' sound, similar to small screws dropping on a cookie sheet or a small rock hitting a window. This sound has been present for approximately six months and seems to be triggered by certain frequencies or pitches. The patient reports that the sound is not constant and can disappear for days at a time before returning. He also notes that the sound does not wake him up from sleep, but he has woken up with the sound present. 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 He also reports that he has difficulty hearing certain things and often relies on lip reading to understand conversations.          Health Maintenance Due  Topic Date Due   INFLUENZA VACCINE  12/05/2022    Past Medical History:  Diagnosis Date   Hypertension    Post traumatic stress disorder    Sleep apnea    Stroke [facility])    TIA 2021    Past Surgical History:  Procedure Laterality Date   APPENDECTOMY     DISTAL BICEPS TENDON REPAIR Right 04/20/2021   Procedure: RIGHT DISTAL BICEPS REPAIR;  Surgeon: Genelle Standing, MD;  Location: [facility] OR;  Service: Orthopedics;  Laterality: Right;   FOOT SURGERY Bilateral    plantar fascitis    Family History  Adopted: Yes    Social History   Socioeconomic History   Marital status: Married    Spouse name: Not on file    Number of children: Not on file   Years of education: Not on file   Highest education level: Not on file  Occupational History   Not on file  Tobacco Use   Smoking status: Every Day    Current packs/day: 0.50    Average packs/day: 0.5 packs/day for 30.0 years (15.0 ttl pk-yrs)    Types: Cigarettes   Smokeless tobacco: Never  Vaping Use   Vaping status: Never Used  Substance and Sexual Activity   Alcohol use: Not Currently    Comment: occ   Drug use: No   Sexual activity: Not on file  Other Topics Concern   Not on file  Social History Narrative   Not on file   Social Drivers of Health   Financial Resource Strain: Not on file  Food Insecurity: Not on file  Transportation Needs: Not on file  Physical Activity: Not on file  Stress: Not on file  Social Connections: Unknown (09/17/2021)   Received from [facility]   Social Network    Social Network: Not on file  Intimate Partner Violence: Unknown (08/09/2021)   [facility]   HITS    Physically Hurt: Not on file    Insult or Talk Down To: Not on  file    Threaten Physical Harm: Not on file    Scream or Curse: Not on file    Outpatient Medications Prior to Visit  Medication Sig Dispense Refill   aspirin  EC 325 MG tablet Take 1 tablet (325 mg total) by mouth daily. 30 tablet 0   atorvastatin  (LIPITOR) 80 MG tablet Take 1 tablet (80 mg total) by mouth daily. 90 tablet 3   buPROPion  (WELLBUTRIN  XL) 300 MG 24 hr tablet Take 1 tablet (300 mg total) by mouth daily. 30 tablet 5   ibuprofen  (ADVIL ) 800 MG tablet Take 1 tablet (800 mg total) by mouth 3 (three) times daily. 21 tablet 0   propranolol  ER (INDERAL  LA) 80 MG 24 hr capsule Take 1 capsule (80 mg total) by mouth daily. 90 capsule 3   QUEtiapine  (SEROQUEL ) 100 MG tablet Take 1 tablet (100 mg total) by mouth at bedtime. 90 tablet 2   tamsulosin  (FLOMAX ) 0.4 MG CAPS capsule Take 1 capsule (0.4 mg total) by mouth daily. 90 capsule 1    tirzepatide  (ZEPBOUND ) 12.5 MG/0.5ML Pen Inject 12.5 mg into the skin once a week for 28 days. 2 mL 0   [START ON 06/04/2023] tirzepatide  (ZEPBOUND ) 15 MG/0.5ML Pen Inject 15 mg into the skin once a week. 2 mL 3   No facility-administered medications prior to visit.    Allergies  Allergen Reactions   Hydrocodone Nausea And Vomiting   Oxycodone  Nausea Only   Simvastatin Rash    ROS See HPI    Objective:    Physical Exam Constitutional:      General: He is not in acute distress.    Appearance: He is well-developed.  HENT:     Head: Normocephalic and atraumatic.     Right Ear: There is impacted cerumen.     Left Ear: Tympanic membrane normal. Tympanic membrane is not injected, perforated or erythematous.  Cardiovascular:     Rate and Rhythm: Normal rate and regular rhythm.     Heart sounds: No murmur heard. Pulmonary:     Effort: Pulmonary effort is normal. No respiratory distress.     Breath sounds: Normal breath sounds. No wheezing or rales.  Skin:    General: Skin is warm and dry.  Neurological:     Mental Status: He is alert and oriented to person, place, and time.  Psychiatric:        Behavior: Behavior normal.        Thought Content: Thought content normal.      BP 123/82 (BP Location: Right Arm, Patient Position: Sitting, Cuff Size: Small)   Pulse 86   Temp 98.6 F (37 C) (Oral)   Resp 16   Ht 5' 7 (1.702 m)   Wt 167 lb (75.8 kg)   SpO2 98%   BMI 26.16 kg/m  Wt Readings from Last 3 Encounters:  05/13/23 167 lb (75.8 kg)  12/27/22 177 lb 4 oz (80.4 kg)  12/18/22 172 lb (78 kg)       Assessment & Plan:   Problem List Items Addressed This Visit       Unprioritized   Tinnitus aurium, left - Primary    History of noise exposure and hearing loss due to [place]. New onset of high-pitched, intermittent, unilateral tinnitus in the left ear for the past six months. No associated hearing loss or pain. -Referral to ENT for further evaluation  and audiology testing. -Consider hearing aids if recommended by ENT, potentially through [place] benefits. -Encourage use of  white noise or similar strategies to manage symptoms.      Relevant Orders   Ambulatory referral to ENT    I am having Socrates Cahoon maintain his aspirin  EC, propranolol  ER, ibuprofen , buPROPion , atorvastatin , tamsulosin , Zepbound , Zepbound , and QUEtiapine .  No orders of the defined types were placed in this encounter.

## 2023-05-13 NOTE — Patient Instructions (Signed)
 VISIT SUMMARY:  You were seen today for a new sound in your left ear that you've been experiencing for the past six months. This sound is described as a 'tinny' noise and is intermittent, triggered by certain frequencies or pitches. You also have a history of hearing loss and tinnitus due to noise exposure during your [place].  YOUR PLAN:  -TINNITUS: Tinnitus is the perception of noise or ringing in the ears. It can be caused by exposure to loud noises, among other factors. We are referring you to an ENT specialist for further evaluation and audiology testing. Depending on the results, hearing aids may be recommended, which could be covered by your [place] benefits. In the meantime, using white noise or similar strategies may help manage your symptoms.  -HEARING LOSS: Hearing loss is a partial or total inability to hear. It can result from exposure to loud noises, such as those experienced during [place]. We recommend a formal hearing evaluation/ENT consultation. If the ENT specialist recommends hearing aids, these may also be covered by your [place] benefits.  INSTRUCTIONS:  Please follow up with the ENT specialist for further evaluation and audiology testing as soon as possible. Consider using white noise to help manage your tinnitus symptoms in the meantime.

## 2023-05-14 ENCOUNTER — Encounter (INDEPENDENT_AMBULATORY_CARE_PROVIDER_SITE_OTHER): Payer: Self-pay | Admitting: Otolaryngology

## 2023-05-20 ENCOUNTER — Ambulatory Visit: Payer: BC Managed Care – PPO | Admitting: Family Medicine

## 2023-05-23 ENCOUNTER — Encounter: Payer: Self-pay | Admitting: Family Medicine

## 2023-05-23 ENCOUNTER — Ambulatory Visit (INDEPENDENT_AMBULATORY_CARE_PROVIDER_SITE_OTHER): Payer: BC Managed Care – PPO | Admitting: Family Medicine

## 2023-05-23 ENCOUNTER — Other Ambulatory Visit (HOSPITAL_BASED_OUTPATIENT_CLINIC_OR_DEPARTMENT_OTHER): Payer: Self-pay

## 2023-05-23 VITALS — BP 122/84 | HR 70 | Temp 98.0°F | Resp 16 | Ht 67.0 in | Wt 163.0 lb

## 2023-05-23 DIAGNOSIS — K2900 Acute gastritis without bleeding: Secondary | ICD-10-CM | POA: Diagnosis not present

## 2023-05-23 LAB — POCT INFLUENZA A/B
Influenza A, POC: NEGATIVE
Influenza B, POC: NEGATIVE

## 2023-05-23 LAB — POC COVID19 BINAXNOW: SARS Coronavirus 2 Ag: NEGATIVE

## 2023-05-23 MED ORDER — ONDANSETRON 4 MG PO TBDP
4.0000 mg | ORAL_TABLET | Freq: Three times a day (TID) | ORAL | 0 refills | Status: DC | PRN
Start: 2023-05-23 — End: 2023-05-29
  Filled 2023-05-23: qty 20, 7d supply, fill #0

## 2023-05-23 NOTE — Progress Notes (Signed)
Chief Complaint  Patient presents with   Cough    Cough and congestion    Ryan Middleton here for URI complaints.  Duration: 3 days  Associated symptoms: Fever (103 F), myalgia, lightheadedness, N/V, DOE, coughing (dry) Denies: sinus congestion, sinus pain, rhinorrhea, itchy watery eyes, ear pain, ear drainage, sore throat, abd pain, wheezing, and diarrhea Treatment to date:  No recent travel or antibiotic use.  Sick contacts: Yes- kids; got better on own  Past Medical History:  Diagnosis Date   Hypertension    Post traumatic stress disorder    Sleep apnea    Stroke (HCC)    TIA 2021    Objective BP 122/84   Pulse 70   Temp 98 F (36.7 C) (Oral)   Resp 16   Ht 5\' 7"  (1.702 m)   Wt 163 lb (73.9 kg)   SpO2 98%   BMI 25.53 kg/m  General: Awake, alert, appears stated age HEENT: AT, [place], ears patent b/l and TM's neg, nares patent w/o discharge, pharynx pink and without exudates, MMD, no sinus ttp b/l Skin: Some tenting over the hands Abd: BS+, S, NT, ND Neck: No masses or asymmetry Heart: RRR Lungs: CTAB, no accessory muscle use Psych: Age appropriate judgment and insight, normal mood and affect  Acute gastritis without hemorrhage, unspecified gastritis type - Plan: ondansetron (ZOFRAN-ODT) 4 MG disintegrating tablet, POCT Influenza A/B, POC COVID-19  He is dehydrated.  This is causing many of his symptoms.  Needs to push fluids.  Zofran given as needed.  If things worsen, he will have to seek immediate care where he can get IVFs. F/u prn. If starting to experience fevers, worsening s/s's, or shortness of breath, seek immediate care. Pt voiced understanding and agreement to the plan.  Jilda Roche [place], DO 05/27/23 8:46 AM

## 2023-05-23 NOTE — Addendum Note (Signed)
Addended by: Kathi Ludwig on: 05/23/2023 11:24 AM   Modules accepted: Orders

## 2023-05-23 NOTE — Patient Instructions (Addendum)
Push lots of fluids.   Food is lower on the priority list.   Let us know if you need anything.

## 2023-05-29 ENCOUNTER — Ambulatory Visit: Payer: BC Managed Care – PPO | Admitting: Physician Assistant

## 2023-05-29 ENCOUNTER — Encounter: Payer: Self-pay | Admitting: Physician Assistant

## 2023-05-29 VITALS — BP 135/92 | HR 77 | Temp 98.8°F | Resp 16 | Ht 67.0 in | Wt 161.0 lb

## 2023-05-29 DIAGNOSIS — G4700 Insomnia, unspecified: Secondary | ICD-10-CM | POA: Diagnosis not present

## 2023-05-29 NOTE — Assessment & Plan Note (Signed)
Recommending increasing seroquel dose from 100 mg to 150 mg at bedtime. Can titrate to 200 mg if needed.

## 2023-05-29 NOTE — Progress Notes (Signed)
Established patient visit   Patient: Ryan Middleton   DOB: 1965/08/22   58 y.o. Male  MRN: [id number] Visit Date: 05/29/2023  Today's healthcare provider: Alfredia Ferguson, PA-C   Cc. Anxiety, insomnia  Subjective    Pt reports he has a new boss at work that he does not get along with. For the last few weeks he has been looking for a new job, has had increased anxiety, and is not sleeping through the night. He is wondering what changes can be made to his medication to help with this.   He does follow with psychiatry, but only every 6 months with the [place]. Medications: Outpatient Medications Prior to Visit  Medication Sig   aspirin EC 325 MG tablet Take 1 tablet (325 mg total) by mouth daily.   atorvastatin (LIPITOR) 80 MG tablet Take 1 tablet (80 mg total) by mouth daily.   buPROPion (WELLBUTRIN XL) 300 MG 24 hr tablet Take 1 tablet (300 mg total) by mouth daily.   propranolol ER (INDERAL LA) 80 MG 24 hr capsule Take 1 capsule (80 mg total) by mouth daily.   QUEtiapine (SEROQUEL) 100 MG tablet Take 1 tablet (100 mg total) by mouth at bedtime.   tamsulosin (FLOMAX) 0.4 MG CAPS capsule Take 1 capsule (0.4 mg total) by mouth daily.   tirzepatide (ZEPBOUND) 12.5 MG/0.5ML Pen Inject 12.5 mg into the skin once a week for 28 days.   [START ON 06/04/2023] tirzepatide (ZEPBOUND) 15 MG/0.5ML Pen Inject 15 mg into the skin once a week.   [DISCONTINUED] ibuprofen (ADVIL) 800 MG tablet Take 1 tablet (800 mg total) by mouth 3 (three) times daily.   [DISCONTINUED] ondansetron (ZOFRAN-ODT) 4 MG disintegrating tablet Take 1 tablet (4 mg total) by mouth every 8 (eight) hours as needed for nausea or vomiting.   No facility-administered medications prior to visit.    Review of Systems  Constitutional:  Negative for fatigue and fever.  Respiratory:  Negative for cough and shortness of breath.   Cardiovascular:  Negative for chest pain, palpitations and leg swelling.  Neurological:  Negative for  dizziness and headaches.  Psychiatric/Behavioral:  Positive for sleep disturbance. The patient is nervous/anxious.        Objective    BP (!) 135/92 (BP Location: Left Arm, Patient Position: Sitting, Cuff Size: Normal)   Pulse 77   Temp 98.8 F (37.1 C) (Oral)   Resp 16   Ht 5\' 7"  (1.702 m)   Wt 161 lb (73 kg)   SpO2 98%   BMI 25.22 kg/m    Physical Exam Vitals reviewed.  Constitutional:      Appearance: He is not ill-appearing.  HENT:     Head: Normocephalic.  Eyes:     Conjunctiva/sclera: Conjunctivae normal.  Cardiovascular:     Rate and Rhythm: Normal rate.  Pulmonary:     Effort: Pulmonary effort is normal. No respiratory distress.  Neurological:     General: No focal deficit present.     Mental Status: He is alert and oriented to person, place, and time.  Psychiatric:        Mood and Affect: Mood normal.        Behavior: Behavior normal.     No results found for any visits on 05/29/23.  Assessment & Plan    Insomnia, unspecified type Assessment & Plan: Recommending increasing seroquel dose from 100 mg to 150 mg at bedtime. Can titrate to 200 mg if needed.    Return  if symptoms worsen or fail to improve.       Alfredia Ferguson, PA-C  [facility] [phone] (phone) [phone] (fax)  [facility]

## 2023-06-03 ENCOUNTER — Ambulatory Visit (INDEPENDENT_AMBULATORY_CARE_PROVIDER_SITE_OTHER): Payer: BC Managed Care – PPO | Admitting: Family Medicine

## 2023-06-03 ENCOUNTER — Encounter: Payer: Self-pay | Admitting: Family Medicine

## 2023-06-03 VITALS — BP 134/86 | HR 92 | Temp 98.0°F | Resp 16 | Ht 67.0 in | Wt 162.0 lb

## 2023-06-03 DIAGNOSIS — S29011A Strain of muscle and tendon of front wall of thorax, initial encounter: Secondary | ICD-10-CM

## 2023-06-03 NOTE — Progress Notes (Signed)
Musculoskeletal Exam  Patient: Ryan Middleton DOB: [birth year]  DOS: 06/03/2023  SUBJECTIVE:  Chief Complaint:   Chief Complaint  Patient presents with   Arm Pain    Right Arm pit pain    [name] is a 58 y.o.  male for evaluation and treatment of R upper chest pain.   Onset:  3 days ago. No inj or change in activity.  Location: lateral R pectoral region Character:  stabbing w certain movements, feels like a scrape/abrasion when he doesn't move it Progression of issue:  has slightly improved Associated symptoms: feels cylindrical prominence over the area No bruising, redness, swelling prominent LN's, SOB, coughing, swelling in legs, recent travel Treatment: to date has been heat, self massage.   Neurovascular symptoms: no  Past Medical History:  Diagnosis Date   Hypertension    Post traumatic stress disorder    Sleep apnea    Stroke [facility])    TIA 2021    Objective: VITAL SIGNS: BP 134/86   Pulse 92   Temp 98 F (36.7 C) (Oral)   Resp 16   Ht 5\' 7"  (1.702 m)   Wt 162 lb (73.5 kg)   SpO2 98%   BMI 25.37 kg/m  Constitutional: Well formed, well developed. No acute distress. Thorax & Lungs: No accessory muscle use Musculoskeletal: chest wall.   Tenderness to palpation: yes over the lateral pectoral region Deformity: +hypertonicity/knot over the lateral pectoral region Ecchymosis: No Neurologic: Normal sensory function. Psychiatric: Normal mood. Age appropriate judgment and insight. Alert & oriented x 3.    Assessment:  Pectoralis muscle strain, initial encounter  Plan: +hypertonicity over muscle fiber in R pec. Stretches/exercises, heat, ice, Tylenol. No s/s's to suggest ACS or PE.  F/u prn. The patient voiced understanding and agreement to the plan.   Jilda Roche [place], DO 06/03/23  5:07 PM

## 2023-06-03 NOTE — Patient Instructions (Signed)
Heat (pad or rice pillow in microwave) over affected area, 10-15 minutes twice daily.   Ice/cold pack over area for 10-15 min twice daily.  OK to take Tylenol 1000 mg (2 extra strength tabs) or 975 mg (3 regular strength tabs) every 6 hours as needed.  Ibuprofen 400-600 mg (2-3 over the counter strength tabs) every 6 hours as needed for pain.  Let us know if you need anything.  Pectoralis Major Rehab Ask your health care provider which exercises are safe for you. Do exercises exactly as told by your health care provider and adjust them as directed. It is normal to feel mild stretching, pulling, tightness, or discomfort as you do these exercises, but you should stop right away if you feel sudden pain or your pain gets worse. Do not begin these exercises until told by your health care provider. Stretching and range of motion exercises These exercises warm up your muscles and joints and improve the movement and flexibility of your shoulder. These exercises can also help to relieve pain, numbness, and tingling. Exercise A: Pendulum  Stand near a wall or a surface that you can hold onto for balance. Bend at the waist and let your left / right arm hang straight down. Use your other arm to keep your balance. Relax your arm and shoulder muscles, and move your hips and your trunk so your left / right arm swings freely. Your arm should swing because of the motion of your body, not because you are using your arm or shoulder muscles. Keep moving so your arm swings in the following directions, as told by your health care provider: Side to side. Forward and backward. In clockwise and counterclockwise circles. Slowly return to the starting position. Repeat 2 times. Complete this exercise 3 times per week. Exercise B: Abduction, standing Stand and hold a broomstick, a cane, or a similar object. Place your hands a little more than shoulder-width apart on the object. Your left / right hand should be palm-up,  and your other hand should be palm-down. While keeping your elbow straight and your shoulder muscles relaxed, push the stick across your body toward your left / right side. Raise your left / right arm to the side of your body and then over your head until you feel a stretch in your shoulder. Stop when you reach the angle that is recommended by your health care provider. Avoid shrugging your shoulder while you raise your arm. Keep your shoulder blade tucked down toward the middle of your spine. Hold for 10 seconds. Slowly return to the starting position. Repeat 2 times. Complete this exercise 3 times per week. Exercise C: Wand flexion, supine  Lie on your back. You may bend your knees for comfort. Hold a broomstick, a cane, or a similar object so that your hands are about shoulder-width apart on the object. Your palms should face toward your feet. Raise your left / right arm in front of your face, then behind your head (toward the floor). Use your other hand to help you do this. Stop when you feel a gentle stretch in your shoulder, or when you reach the angle that is recommended by your health care provider. Hold for 3 seconds. Use the broomstick and your other arm to help you return your left / right arm to the starting position. Repeat 2 times. Complete this exercise 3 times per week. Exercise D: Wand shoulder external rotation Stand and hold a broomstick, a cane, or a similar object so your  hands are about shoulder-width apart on the object. Start with your arms hanging down, then bend both elbows to an "L" shape (90 degrees). Keep your left / right elbow at your side. Use your other hand to push the stick so your left / right forearm moves away from your body, out to your side. Keep your left / right elbow bent to 90 degrees and keep it against your side. Stop when you feel a gentle stretch in your shoulder, or when you reach the angle recommended by your health care provider. Hold for 10  seconds. Use the stick to help you return your left / right arm to the starting position. Repeat 2 times. Complete this exercise 3 times per week. Strengthening exercises These exercises build strength and endurance in your shoulder. Endurance is the ability to use your muscles for a long time, even after your muscles get tired. Exercise E: Scapular protraction, standing Stand so you are facing a wall. Place your feet about one arm-length away from the wall. Place your hands on the wall and straighten your elbows. Keep your hands on the wall as you push your upper back away from the wall. You should feel your shoulder blades sliding forward. Keep your elbows and your head still. If you are not sure that you are doing this exercise correctly, ask your health care provider for more instructions. Hold for 3 seconds. Slowly return to the starting position. Let your muscles relax completely before you repeat this exercise. Repeat 2 times. Complete this exercise 3 times per week. Exercise F: Shoulder blade squeezes  (scapular retraction) Sit with good posture in a stable chair. Do not let your back touch the back of the chair. Your arms should be at your sides with your elbows bent. You may rest your forearms on a pillow if that is more comfortable. Squeeze your shoulder blades together. Bring them down and back. Keep your shoulders level. Do not lift your shoulders up toward your ears. Hold for 3 seconds. Return to the starting position. Repeat 2 times. Complete this exercise 3 times per week. This information is not intended to replace advice given to you by your health care provider. Make sure you discuss any questions you have with your health care provider. Document Released: 04/22/2005 Document Revised: 02/01/2016 Document Reviewed: 01/08/2015 Elsevier Interactive Patient Education  [employer].

## 2023-07-01 ENCOUNTER — Other Ambulatory Visit (HOSPITAL_BASED_OUTPATIENT_CLINIC_OR_DEPARTMENT_OTHER): Payer: Self-pay

## 2023-07-01 ENCOUNTER — Other Ambulatory Visit: Payer: Self-pay | Admitting: Family Medicine

## 2023-07-01 MED ORDER — BUPROPION HCL ER (XL) 300 MG PO TB24
300.0000 mg | ORAL_TABLET | Freq: Every day | ORAL | 5 refills | Status: DC
  Filled 2023-07-01: qty 30, 30d supply, fill #0
  Filled 2023-08-01 – 2023-08-04 (×2): qty 30, 30d supply, fill #1
  Filled 2023-09-08: qty 30, 30d supply, fill #2
  Filled 2023-10-14 – 2023-11-11 (×2): qty 30, 30d supply, fill #3
  Filled 2023-12-07: qty 30, 30d supply, fill #4
  Filled 2024-01-06: qty 30, 30d supply, fill #5

## 2023-07-04 ENCOUNTER — Other Ambulatory Visit (HOSPITAL_BASED_OUTPATIENT_CLINIC_OR_DEPARTMENT_OTHER): Payer: Self-pay

## 2023-07-04 ENCOUNTER — Encounter: Payer: Self-pay | Admitting: Family Medicine

## 2023-07-04 ENCOUNTER — Telehealth: Payer: Self-pay

## 2023-07-04 MED ORDER — ZEPBOUND 10 MG/0.5ML ~~LOC~~ SOAJ
10.0000 mg | SUBCUTANEOUS | 2 refills | Status: DC
  Filled 2023-07-04 – 2023-07-07 (×3): qty 2, 28d supply, fill #0
  Filled 2023-08-11: qty 2, 28d supply, fill #1
  Filled 2023-09-19: qty 2, 28d supply, fill #2

## 2023-07-04 NOTE — Telephone Encounter (Signed)
 Looks like patient has 15 mg dosage, but wants to go back down to 10 mg. Pended. Sign if appropriate.

## 2023-07-04 NOTE — Telephone Encounter (Signed)
 Patient send mychart message requesting refill on Zepbound and have refills left at pharmacy.

## 2023-07-07 ENCOUNTER — Other Ambulatory Visit (HOSPITAL_BASED_OUTPATIENT_CLINIC_OR_DEPARTMENT_OTHER): Payer: Self-pay

## 2023-07-08 ENCOUNTER — Other Ambulatory Visit (HOSPITAL_BASED_OUTPATIENT_CLINIC_OR_DEPARTMENT_OTHER): Payer: Self-pay

## 2023-07-14 DIAGNOSIS — M19012 Primary osteoarthritis, left shoulder: Secondary | ICD-10-CM | POA: Diagnosis not present

## 2023-07-14 DIAGNOSIS — M19011 Primary osteoarthritis, right shoulder: Secondary | ICD-10-CM | POA: Diagnosis not present

## 2023-07-31 DIAGNOSIS — L720 Epidermal cyst: Secondary | ICD-10-CM | POA: Diagnosis not present

## 2023-07-31 DIAGNOSIS — L570 Actinic keratosis: Secondary | ICD-10-CM | POA: Diagnosis not present

## 2023-07-31 DIAGNOSIS — L821 Other seborrheic keratosis: Secondary | ICD-10-CM | POA: Diagnosis not present

## 2023-07-31 DIAGNOSIS — D1801 Hemangioma of skin and subcutaneous tissue: Secondary | ICD-10-CM | POA: Diagnosis not present

## 2023-07-31 DIAGNOSIS — L814 Other melanin hyperpigmentation: Secondary | ICD-10-CM | POA: Diagnosis not present

## 2023-08-01 ENCOUNTER — Other Ambulatory Visit (HOSPITAL_BASED_OUTPATIENT_CLINIC_OR_DEPARTMENT_OTHER): Payer: Self-pay

## 2023-08-01 ENCOUNTER — Other Ambulatory Visit: Payer: Self-pay

## 2023-08-04 ENCOUNTER — Ambulatory Visit (INDEPENDENT_AMBULATORY_CARE_PROVIDER_SITE_OTHER): Payer: BC Managed Care – PPO | Admitting: Audiology

## 2023-08-04 ENCOUNTER — Institutional Professional Consult (permissible substitution) (INDEPENDENT_AMBULATORY_CARE_PROVIDER_SITE_OTHER): Payer: BC Managed Care – PPO

## 2023-08-04 DIAGNOSIS — L720 Epidermal cyst: Secondary | ICD-10-CM | POA: Diagnosis not present

## 2023-08-11 ENCOUNTER — Other Ambulatory Visit (HOSPITAL_BASED_OUTPATIENT_CLINIC_OR_DEPARTMENT_OTHER): Payer: Self-pay

## 2023-08-14 ENCOUNTER — Other Ambulatory Visit (HOSPITAL_BASED_OUTPATIENT_CLINIC_OR_DEPARTMENT_OTHER): Payer: Self-pay

## 2023-08-14 DIAGNOSIS — L720 Epidermal cyst: Secondary | ICD-10-CM | POA: Diagnosis not present

## 2023-08-22 ENCOUNTER — Ambulatory Visit (INDEPENDENT_AMBULATORY_CARE_PROVIDER_SITE_OTHER): Admitting: Audiology

## 2023-08-22 ENCOUNTER — Other Ambulatory Visit (INDEPENDENT_AMBULATORY_CARE_PROVIDER_SITE_OTHER): Payer: Self-pay | Admitting: Physician Assistant

## 2023-08-22 ENCOUNTER — Institutional Professional Consult (permissible substitution) (INDEPENDENT_AMBULATORY_CARE_PROVIDER_SITE_OTHER)

## 2023-08-22 DIAGNOSIS — H9319 Tinnitus, unspecified ear: Secondary | ICD-10-CM

## 2023-08-22 NOTE — Progress Notes (Signed)
 Order for hearing eval

## 2023-09-08 ENCOUNTER — Ambulatory Visit: Admitting: Family Medicine

## 2023-09-08 ENCOUNTER — Encounter: Payer: Self-pay | Admitting: Family Medicine

## 2023-09-08 VITALS — BP 130/80 | HR 88 | Temp 98.0°F | Resp 16 | Ht 67.0 in | Wt 166.0 lb

## 2023-09-08 DIAGNOSIS — M542 Cervicalgia: Secondary | ICD-10-CM

## 2023-09-08 NOTE — Patient Instructions (Signed)
 Heat (pad or rice pillow in microwave) over affected area, 10-15 minutes twice daily.   Ice/cold pack over area for 10-15 min twice daily.  OK to take Tylenol  1000 mg (2 extra strength tabs) or 975 mg (3 regular strength tabs) every 6 hours as needed.  OK to use Debrox (peroxide) in the ear to loosen up wax. Also recommend using a bulb syringe (for removing boogers from baby's noses) to flush through warm water and vinegar (3-4:1 ratio). An alternative, though more expensive, is an elephant ear washer wax removal kit. Do not use Q-tips as this can impact wax further.  Let us  know if you need anything.

## 2023-09-08 NOTE — Progress Notes (Signed)
 Musculoskeletal Exam  Patient: Ryan Middleton DOB: [birth year]  DOS: 09/08/2023  SUBJECTIVE:  Chief Complaint:   Chief Complaint  Patient presents with   Neck Swelling    Right Neck Swelling    Geronimo Esperanza is a 58 y.o.  male for evaluation and treatment of neck pain.   Onset:  2 weeks ago. No inj or change in activity.  Location: R portion of neck Character:  aching and burning  Progression of issue:  has slightly improved Associated symptoms: no bruising, redness, swelling, decreased ROM, ear pain Treatment: to date has been rest.   Neurovascular symptoms: no  Past Medical History:  Diagnosis Date   Hypertension    Post traumatic stress disorder    Sleep apnea    Stroke [facility])    TIA 2021    Objective: VITAL SIGNS: BP 130/80 (BP Location: Left Arm, Patient Position: Sitting)   Pulse 88   Temp 98 F (36.7 C) (Oral)   Resp 16   Ht 5\' 7"  (1.702 m)   Wt 166 lb (75.3 kg)   SpO2 98%   BMI 26.00 kg/m  Constitutional: Well formed, well developed. No acute distress. Thorax & Lungs: No accessory muscle use Musculoskeletal: neck.   Normal active range of motion: no.   Normal passive range of motion: no Tenderness to palpation: not over SCM Mild TTP over the bilateral trapezius musculature with hypertonicity Deformity: no Ecchymosis: no Tests positive: none Tests negative: Spurling's Neurologic: Normal sensory function. No focal deficits noted. DTR's equal and symmetric in UE's. No clonus. Psychiatric: Normal mood. Age appropriate judgment and insight. Alert & oriented x 3.    Assessment:  Neck pain on right side  Plan: Stretches/exercises, heat, ice, Tylenol . PT if no better.  F/u prn. The patient voiced understanding and agreement to the plan.   Shellie Dials [place], DO 09/08/23  4:17 PM

## 2023-09-19 ENCOUNTER — Other Ambulatory Visit (HOSPITAL_BASED_OUTPATIENT_CLINIC_OR_DEPARTMENT_OTHER): Payer: Self-pay

## 2023-09-30 ENCOUNTER — Encounter (INDEPENDENT_AMBULATORY_CARE_PROVIDER_SITE_OTHER): Payer: Self-pay | Admitting: Family Medicine

## 2023-09-30 DIAGNOSIS — F40243 Fear of flying: Secondary | ICD-10-CM

## 2023-10-01 ENCOUNTER — Other Ambulatory Visit (HOSPITAL_BASED_OUTPATIENT_CLINIC_OR_DEPARTMENT_OTHER): Payer: Self-pay

## 2023-10-01 ENCOUNTER — Other Ambulatory Visit: Payer: Self-pay | Admitting: Family Medicine

## 2023-10-01 DIAGNOSIS — F40243 Fear of flying: Secondary | ICD-10-CM

## 2023-10-01 DIAGNOSIS — N401 Enlarged prostate with lower urinary tract symptoms: Secondary | ICD-10-CM

## 2023-10-01 MED ORDER — ALPRAZOLAM 0.5 MG PO TABS
ORAL_TABLET | ORAL | 0 refills | Status: AC
  Filled 2023-10-01: qty 10, 10d supply, fill #0

## 2023-10-01 NOTE — Telephone Encounter (Signed)

## 2023-10-23 ENCOUNTER — Encounter: Payer: Self-pay | Admitting: Family Medicine

## 2023-10-23 MED ORDER — QUETIAPINE FUMARATE 100 MG PO TABS: 100.0000 mg | ORAL_TABLET | Freq: Every day | ORAL | 0 refills | Status: DC

## 2023-10-27 ENCOUNTER — Other Ambulatory Visit: Payer: Self-pay | Admitting: Family Medicine

## 2023-10-27 ENCOUNTER — Other Ambulatory Visit (HOSPITAL_BASED_OUTPATIENT_CLINIC_OR_DEPARTMENT_OTHER): Payer: Self-pay

## 2023-10-27 MED ORDER — ZEPBOUND 10 MG/0.5ML ~~LOC~~ SOAJ
10.0000 mg | SUBCUTANEOUS | 2 refills | Status: DC
  Filled 2023-10-27: qty 2, 28d supply, fill #0
  Filled 2023-11-29: qty 2, 28d supply, fill #1
  Filled 2024-01-06: qty 2, 28d supply, fill #2

## 2023-10-28 ENCOUNTER — Other Ambulatory Visit (HOSPITAL_BASED_OUTPATIENT_CLINIC_OR_DEPARTMENT_OTHER): Payer: Self-pay

## 2023-11-17 ENCOUNTER — Encounter: Payer: Self-pay | Admitting: Family Medicine

## 2023-11-17 ENCOUNTER — Other Ambulatory Visit (HOSPITAL_BASED_OUTPATIENT_CLINIC_OR_DEPARTMENT_OTHER): Payer: Self-pay

## 2023-11-17 ENCOUNTER — Ambulatory Visit: Admitting: Family Medicine

## 2023-11-17 VITALS — BP 118/76 | HR 82 | Temp 97.4°F | Resp 16 | Ht 67.0 in | Wt 166.0 lb

## 2023-11-17 DIAGNOSIS — J189 Pneumonia, unspecified organism: Secondary | ICD-10-CM | POA: Diagnosis not present

## 2023-11-17 MED ORDER — DOXYCYCLINE HYCLATE 100 MG PO TABS
100.0000 mg | ORAL_TABLET | Freq: Two times a day (BID) | ORAL | 0 refills | Status: AC
  Filled 2023-11-17: qty 14, 7d supply, fill #0

## 2023-11-17 MED ORDER — PREDNISONE 20 MG PO TABS
40.0000 mg | ORAL_TABLET | Freq: Every day | ORAL | 0 refills | Status: AC
  Filled 2023-11-17: qty 10, 5d supply, fill #0

## 2023-11-17 NOTE — Progress Notes (Signed)
 Chief Complaint  Patient presents with   Cough    Cough     Ryan Middleton here for URI complaints.  Duration: 2 weeks  Associated symptoms: wheezing, severe shortness of breath when he is in the heat/humidity (resolves when he comes in [facility]), and productive cough Denies: sinus congestion, sinus pain, rhinorrhea, itchy watery eyes, ear pain, ear drainage, sore throat, chest pain, myalgia, and fevers Treatment to date: None Sick contacts: Maybe his kids  Past Medical History:  Diagnosis Date   Hypertension    Post traumatic stress disorder    Sleep apnea    Stroke ([facility])    TIA 2021    Objective BP 118/76 (BP Location: Left Arm, Patient Position: Sitting)   Pulse 82   Temp (!) 97.4 F (36.3 C) (Oral)   Resp 16   Ht 5' 7 (1.702 m)   Wt 166 lb (75.3 kg)   SpO2 95%   BMI 26.00 kg/m  General: Awake, alert, appears stated age HEENT: AT, [place], ears patent b/l and TM's neg, nares patent w/o discharge, pharynx pink and without exudates, MMM, no sinus ttp Neck: No masses or asymmetry Heart: RRR Lungs: Coarse breath sounds at bases, no accessory muscle use Psych: Age appropriate judgment and insight, normal mood and affect  Walking pneumonia  7 d of doxy, 5 d pred burst 40 mg/d. If worsening s/s's, go to ER. I think he is having inflammation in his lungs exacerbated by humidity heat causing his intermittent s/s's. Continue to push fluids, practice good hand hygiene, cover mouth when coughing. F/u prn. If starting to experience fevers, shaking, or shortness of breath, seek immediate care. Pt voiced understanding and agreement to the plan.  Mabel Mt [place], DO 11/17/23 3:57 PM

## 2023-11-17 NOTE — Patient Instructions (Signed)
 If things worsen, seek immediate care.  Continue to push fluids, practice good hand hygiene, and cover your mouth if you cough.  If you start having fevers, shaking or shortness of breath, seek immediate care.  OK to take Tylenol  1000 mg (2 extra strength tabs) or 975 mg (3 regular strength tabs) every 6 hours as needed.  Let us  know if you need anything.

## 2023-11-29 ENCOUNTER — Other Ambulatory Visit: Payer: Self-pay

## 2024-01-06 ENCOUNTER — Other Ambulatory Visit (HOSPITAL_BASED_OUTPATIENT_CLINIC_OR_DEPARTMENT_OTHER): Payer: Self-pay

## 2024-02-02 ENCOUNTER — Other Ambulatory Visit: Payer: Self-pay | Admitting: Family Medicine

## 2024-02-03 ENCOUNTER — Other Ambulatory Visit (HOSPITAL_BASED_OUTPATIENT_CLINIC_OR_DEPARTMENT_OTHER): Payer: Self-pay

## 2024-02-03 MED ORDER — BUPROPION HCL ER (XL) 300 MG PO TB24
300.0000 mg | ORAL_TABLET | Freq: Every day | ORAL | 5 refills | Status: AC
  Filled 2024-02-03: qty 30, 30d supply, fill #0
  Filled 2024-03-02: qty 30, 30d supply, fill #1
  Filled 2024-03-22 – 2024-04-15 (×3): qty 30, 30d supply, fill #2
  Filled 2024-05-10: qty 30, 30d supply, fill #3
  Filled 2024-06-09: qty 30, 30d supply, fill #4

## 2024-03-06 ENCOUNTER — Other Ambulatory Visit: Payer: Self-pay | Admitting: Family Medicine

## 2024-03-22 ENCOUNTER — Other Ambulatory Visit (HOSPITAL_BASED_OUTPATIENT_CLINIC_OR_DEPARTMENT_OTHER): Payer: Self-pay

## 2024-03-24 ENCOUNTER — Encounter: Payer: Self-pay | Admitting: Family Medicine

## 2024-03-24 ENCOUNTER — Other Ambulatory Visit (HOSPITAL_BASED_OUTPATIENT_CLINIC_OR_DEPARTMENT_OTHER): Payer: Self-pay

## 2024-03-24 ENCOUNTER — Ambulatory Visit: Admitting: Family Medicine

## 2024-03-24 VITALS — BP 126/82 | HR 97 | Temp 98.0°F | Resp 16 | Ht 67.0 in | Wt 181.4 lb

## 2024-03-24 DIAGNOSIS — G8929 Other chronic pain: Secondary | ICD-10-CM | POA: Diagnosis not present

## 2024-03-24 DIAGNOSIS — S46811A Strain of other muscles, fascia and tendons at shoulder and upper arm level, right arm, initial encounter: Secondary | ICD-10-CM

## 2024-03-24 DIAGNOSIS — M542 Cervicalgia: Secondary | ICD-10-CM

## 2024-03-24 MED ORDER — MELOXICAM 15 MG PO TABS
15.0000 mg | ORAL_TABLET | Freq: Every day | ORAL | 0 refills | Status: AC
Start: 2024-03-24 — End: ?
  Filled 2024-03-24: qty 21, 21d supply, fill #0

## 2024-03-24 MED ORDER — METHOCARBAMOL 500 MG PO TABS
500.0000 mg | ORAL_TABLET | Freq: Three times a day (TID) | ORAL | 0 refills | Status: AC | PRN
Start: 2024-03-24 — End: ?
  Filled 2024-03-24: qty 21, 7d supply, fill #0

## 2024-03-24 NOTE — Patient Instructions (Signed)
If you do not hear anything about your referral in the next 1-2 weeks, call our office and ask for an update.  Heat (pad or rice pillow in microwave) over affected area, 10-15 minutes twice daily.   Ice/cold pack over area for 10-15 min twice daily.  OK to take Tylenol 1000 mg (2 extra strength tabs) or 975 mg (3 regular strength tabs) every 6 hours as needed.   Let us know if you need anything. 

## 2024-03-24 NOTE — Progress Notes (Signed)
 Musculoskeletal Exam  Patient: Ryan Middleton DOB: 1965-06-27  DOS: 03/24/2024  SUBJECTIVE:  Chief Complaint:   Chief Complaint  Patient presents with   Neck Pain    Neck Pain    Ryan Middleton is a 58 y.o.  male for evaluation and treatment of neck pain.   Onset:  40 yrs ago. Neck inj in football Location: back of neck on R Character:  sharp and tight band  Progression of issue:  worsening in last 2-3 mo Associated symptoms: nml ROM at baseline; no bruising, redness, swelling Treatment: to date has been heat.   Neurovascular symptoms: no  Past Medical History:  Diagnosis Date   Hypertension    Post traumatic stress disorder    Sleep apnea    Stroke [facility])    TIA 2021    Objective: VITAL SIGNS: BP 126/82 (BP Location: Left Arm, Patient Position: Sitting)   Pulse 97   Temp 98 F (36.7 C) (Oral)   Resp 16   Ht 5' 7 (1.702 m)   Wt 181 lb 6.4 oz (82.3 kg)   SpO2 98%   BMI 28.41 kg/m  Constitutional: Well formed, well developed. No acute distress. Thorax & Lungs: No accessory muscle use Musculoskeletal: Neck.   Normal active range of motion: yes.   Normal passive range of motion: yes Tenderness to palpation: Yes over the lateral right paraspinal musculature, right trapezius region Deformity: no Ecchymosis: no Tests positive: None Tests negative: Spurling's Neurologic: Normal sensory function. No focal deficits noted. DTR's equal and symmetric in UE's. No clonus.  Grip strength adequate.  Gait is normal. Psychiatric: Normal mood. Age appropriate judgment and insight. Alert & oriented x 3.    Assessment:  Chronic neck pain - Plan: meloxicam  (MOBIC ) 15 MG tablet, methocarbamol  (ROBAXIN ) 500 MG tablet, Ambulatory referral to Orthopedic Surgery  Trapezius strain, right, initial encounter - Plan: meloxicam  (MOBIC ) 15 MG tablet, methocarbamol  (ROBAXIN ) 500 MG tablet  Plan: Chronic, not controlled.  Refer to orthospine.  Meloxicam  15 mg daily as needed.   Methocarbamol  500 mg 3 times daily as needed, warned about potential drowsiness with this.  Stretches/exercises, heat, ice, Tylenol .  Heat, ice, elevation, stretches exercises, medication F/u as originally scheduled. The patient voiced understanding and agreement to the plan.   Mabel Mt [place], DO 03/24/24  4:51 PM

## 2024-03-25 ENCOUNTER — Other Ambulatory Visit: Payer: Self-pay | Admitting: Family Medicine

## 2024-03-25 DIAGNOSIS — N401 Enlarged prostate with lower urinary tract symptoms: Secondary | ICD-10-CM

## 2024-04-08 ENCOUNTER — Other Ambulatory Visit (HOSPITAL_BASED_OUTPATIENT_CLINIC_OR_DEPARTMENT_OTHER): Payer: Self-pay

## 2024-04-15 ENCOUNTER — Encounter: Payer: Self-pay | Admitting: Physical Medicine and Rehabilitation

## 2024-04-15 ENCOUNTER — Other Ambulatory Visit: Payer: Self-pay

## 2024-04-15 ENCOUNTER — Ambulatory Visit: Admitting: Physical Medicine and Rehabilitation

## 2024-04-15 DIAGNOSIS — M542 Cervicalgia: Secondary | ICD-10-CM | POA: Diagnosis not present

## 2024-04-15 DIAGNOSIS — M47812 Spondylosis without myelopathy or radiculopathy, cervical region: Secondary | ICD-10-CM | POA: Diagnosis not present

## 2024-04-15 DIAGNOSIS — M47819 Spondylosis without myelopathy or radiculopathy, site unspecified: Secondary | ICD-10-CM | POA: Diagnosis not present

## 2024-04-15 NOTE — Progress Notes (Unsigned)
 Pain Scale   Average Pain 7 Patient advising he has neck pain radiating to right side. Pain is constant.        +Driver, -BT, -Dye Allergies.

## 2024-04-15 NOTE — Progress Notes (Unsigned)
 Ryan Middleton - 58 y.o. male MRN [id number]  Date of birth: [birth year]  Office Visit Note: Visit Date: 04/15/2024 PCP: Frann Mabel Mt, DO Referred by: Frann Mabel Mt, DO  Subjective: Chief Complaint  Patient presents with   Neck - Pain   HPI: Ryan Middleton is a 58 y.o. male who comes in today per the request of Dr. Mabel Mt Frann for evaluation of chronic, worsening and severe right sided neck pain. He reports history of neck injury playing football in high school. Was told that he had cervical compression fracture. Pain ongoing for several years. His pain worsens with movement and activity. Feels like his cervical spine is slipping out of alignment. Currently rates as 7 out of 10. Some relief of pain with home exercise regimen, rest and use of medications. Minimal relief of pain with meloxicam  and methocarbamol . History of chiropractic treatments in the past that caused increased pain. No recent imaging of cervical spine. He is currently working full time for [employer], states his job does involve physical activity. Patient denies focal weakness, numbness and tingling. No recent trauma or falls.      Review of Systems  Musculoskeletal:  Positive for myalgias and neck pain.  Neurological:  Negative for tingling, sensory change, focal weakness and weakness.  All other systems reviewed and are negative.  Otherwise per HPI.  Assessment & Plan: Visit Diagnoses:    ICD-10-CM   1. Cervicalgia  M54.2 XR Cervical Spine 2 or 3 views    MR CERVICAL SPINE WO CONTRAST    2. Spondylosis without myelopathy or radiculopathy  M47.819     3. Facet arthropathy, cervical  M47.812        Plan: Findings:  Chronic, worsening and severe right sided neck pain. Pain is localized to lower cervical/trapezius region. Patient continues to have severe pain despite good conservative therapies such as chiropractic treatments, home exercise regimen, rest and use of medications. I  obtained cervical radiographs in the office today that show multi level degenerative changes, most prominent at C5-C6 where there is disc height loss, facet arthropathy and uncovertebral hypertrophy. Patients clinical presentation and exam are most consistent with facet mediated pain. He has discomfort with side to side rotation of his neck, particularly right sided movements. No radicular pain down the arms. We discussed treatment plan in detail today. Next step is to place order for cervical MRI imaging. Depending on results of MRI imaging we discussed possibility of performing cervical facet joint injections. He has no questions at this time. We will see him back for cervical MRI review. His exam today is non focal, good strength noted to bilateral upper extremities.     Meds & Orders: No orders of the defined types were placed in this encounter.   Orders Placed This Encounter  Procedures   XR Cervical Spine 2 or 3 views   MR CERVICAL SPINE WO CONTRAST    Follow-up: Return for Cervical MRI review.   Procedures: No procedures performed      Clinical History: No specialty comments available.   He reports that he has been smoking cigarettes. He has a 15 pack-year smoking history. He has never used smokeless tobacco. No results for input(s): HGBA1C, LABURIC in the last 8760 hours.  Objective:  VS:  HT:    WT:   BMI:     BP:   HR: bpm  TEMP: ( )  RESP:  Physical Exam Vitals and nursing note reviewed.  HENT:     Head:  Normocephalic and atraumatic.     Right Ear: External ear normal.     Left Ear: External ear normal.     Nose: Nose normal.     Mouth/Throat:     Mouth: Mucous membranes are dry.  Eyes:     Extraocular Movements: Extraocular movements intact.  Cardiovascular:     Rate and Rhythm: Normal rate.     Pulses: Normal pulses.  Pulmonary:     Effort: Pulmonary effort is normal.  Abdominal:     General: Abdomen is flat. There is no distension.  Musculoskeletal:         General: Tenderness present.     Cervical back: Tenderness present.     Comments: Discomfort noted with side to side rotation, particularly right sided movement. Patient has good strength in the upper extremities including 5 out of 5 strength in wrist extension, long finger flexion and APB. Shoulder range of motion is full bilaterally without any sign of impingement. There is no atrophy of the hands intrinsically. Sensation intact bilaterally. Myofascial tenderness noted upon palpation of right trapezius region. Negative Hoffman's sign. Negative Spurling's sign.     Skin:    General: Skin is warm and dry.     Capillary Refill: Capillary refill takes less than 2 seconds.  Neurological:     General: No focal deficit present.     Mental Status: He is alert and oriented to person, place, and time.  Psychiatric:        Mood and Affect: Mood normal.        Behavior: Behavior normal.     Ortho Exam  Imaging: XR Cervical Spine 2 or 3 views Result Date: 04/15/2024 AP and lateral radiographs show straightening of normal cervical lordosis. There are multi level degenerative changes and facet arthropathy. Most prominent at C5-C6 where there is disc height loss, facet arthropathy and uncovertebral joint hypertrophy. No fractures noted.    Past Medical/Family/Surgical/Social History: Medications & Allergies reviewed per EMR, new medications updated. Patient Active Problem List   Diagnosis Date Noted   Chronic neck pain 03/24/2024   Insomnia 05/29/2023   Tinnitus aurium, left 05/13/2023   Elevated PSA; negative biopsy 6/20 11/27/2022   BPH with obstruction/lower urinary tract symptoms 09/03/2022   Obesity (BMI 30-39.9) 03/26/2022   Tobacco abuse 03/26/2022   GAD (generalized anxiety disorder) 03/26/2022   DDD (degenerative disc disease), lumbar 11/29/2021   Mixed hyperlipidemia 10/16/2021   Class 2 severe obesity due to excess calories with serious comorbidity and body mass index (BMI) of  35.0 to 35.9 in adult 10/16/2021   Biceps tendon tear    Pain in right shoulder 03/01/2021   Pain in right elbow 03/01/2021   TIA (transient ischemic attack) 07/25/2020   Past Medical History:  Diagnosis Date   Hypertension    Post traumatic stress disorder    Sleep apnea    Stroke [facility])    TIA 2021   Family History  Adopted: Yes   Past Surgical History:  Procedure Laterality Date   APPENDECTOMY     DISTAL BICEPS TENDON REPAIR Right 04/20/2021   Procedure: RIGHT DISTAL BICEPS REPAIR;  Surgeon: Genelle Standing, MD;  Location: [facility] OR;  Service: Orthopedics;  Laterality: Right;   FOOT SURGERY Bilateral    plantar fascitis   Social History   Occupational History   Not on file  Tobacco Use   Smoking status: Every Day    Current packs/day: 0.50    Average packs/day: 0.5 packs/day for 30.0 years (  15.0 ttl pk-yrs)    Types: Cigarettes   Smokeless tobacco: Never  Vaping Use   Vaping status: Never Used  Substance and Sexual Activity   Alcohol use: Not Currently    Comment: occ   Drug use: No   Sexual activity: Not on file

## 2024-04-20 ENCOUNTER — Encounter: Payer: Self-pay | Admitting: Physical Medicine and Rehabilitation

## 2024-04-21 ENCOUNTER — Other Ambulatory Visit: Payer: Self-pay | Admitting: Family Medicine

## 2024-04-21 ENCOUNTER — Other Ambulatory Visit (HOSPITAL_BASED_OUTPATIENT_CLINIC_OR_DEPARTMENT_OTHER): Payer: Self-pay

## 2024-04-21 MED ORDER — ZEPBOUND 10 MG/0.5ML ~~LOC~~ SOAJ
10.0000 mg | SUBCUTANEOUS | 2 refills | Status: AC
  Filled 2024-04-21: qty 2, 28d supply, fill #0
  Filled 2024-06-01: qty 2, 28d supply, fill #1

## 2024-04-27 ENCOUNTER — Other Ambulatory Visit

## 2024-05-05 ENCOUNTER — Ambulatory Visit
Admission: RE | Admit: 2024-05-05 | Discharge: 2024-05-05 | Disposition: A | Discharge status: Home / Self Care | Source: Ambulatory Visit | Attending: Physical Medicine and Rehabilitation | Admitting: Physical Medicine and Rehabilitation

## 2024-05-10 ENCOUNTER — Other Ambulatory Visit (HOSPITAL_BASED_OUTPATIENT_CLINIC_OR_DEPARTMENT_OTHER): Payer: Self-pay

## 2024-05-11 ENCOUNTER — Encounter: Payer: Self-pay | Admitting: Family Medicine

## 2024-05-11 ENCOUNTER — Other Ambulatory Visit: Payer: Self-pay | Admitting: Family Medicine

## 2024-05-11 ENCOUNTER — Other Ambulatory Visit (HOSPITAL_BASED_OUTPATIENT_CLINIC_OR_DEPARTMENT_OTHER): Payer: Self-pay

## 2024-05-11 MED ORDER — QUETIAPINE FUMARATE 100 MG PO TABS
100.0000 mg | ORAL_TABLET | Freq: Every day | ORAL | 0 refills | Status: AC
  Filled 2024-05-11: qty 90, 90d supply, fill #0

## 2024-06-01 ENCOUNTER — Other Ambulatory Visit (HOSPITAL_BASED_OUTPATIENT_CLINIC_OR_DEPARTMENT_OTHER): Payer: Self-pay

## 2024-06-09 ENCOUNTER — Other Ambulatory Visit: Payer: Self-pay
